# Patient Record
Sex: Male | Born: 1970 | Race: Black or African American | Hispanic: No | Marital: Married | Smoking: Never smoker
Health system: Southern US, Community
[De-identification: ages and names within clinical notes are randomized; demographics above are authoritative.]

## PROBLEM LIST (undated history)

## (undated) HISTORY — PX: NO PAST SURGERIES: SHX2092

---

## 2001-12-08 ENCOUNTER — Emergency Department (HOSPITAL_COMMUNITY): Admission: EM | Admit: 2001-12-08 | Discharge: 2001-12-08 | Payer: Self-pay | Admitting: Emergency Medicine

## 2001-12-08 ENCOUNTER — Encounter: Payer: Self-pay | Admitting: Emergency Medicine

## 2003-12-03 ENCOUNTER — Ambulatory Visit: Payer: Self-pay | Admitting: Family Medicine

## 2004-09-13 ENCOUNTER — Ambulatory Visit: Payer: Self-pay | Admitting: Family Medicine

## 2004-09-15 ENCOUNTER — Ambulatory Visit: Payer: Self-pay | Admitting: Family Medicine

## 2005-02-28 ENCOUNTER — Ambulatory Visit: Payer: Self-pay | Admitting: Family Medicine

## 2006-03-15 ENCOUNTER — Ambulatory Visit: Payer: Self-pay | Admitting: Family Medicine

## 2006-05-03 ENCOUNTER — Ambulatory Visit: Payer: Self-pay | Admitting: Family Medicine

## 2007-05-29 ENCOUNTER — Ambulatory Visit: Payer: Self-pay | Admitting: Family Medicine

## 2007-05-29 DIAGNOSIS — J019 Acute sinusitis, unspecified: Secondary | ICD-10-CM

## 2008-06-02 ENCOUNTER — Encounter: Admission: RE | Admit: 2008-06-02 | Discharge: 2008-06-02 | Payer: Self-pay | Admitting: Occupational Medicine

## 2010-07-06 ENCOUNTER — Encounter: Payer: Self-pay | Admitting: Family Medicine

## 2010-07-10 ENCOUNTER — Encounter: Payer: Self-pay | Admitting: Family Medicine

## 2010-07-10 ENCOUNTER — Ambulatory Visit (INDEPENDENT_AMBULATORY_CARE_PROVIDER_SITE_OTHER): Payer: BC Managed Care – PPO | Admitting: Family Medicine

## 2010-07-10 VITALS — BP 98/60 | HR 49 | Temp 98.3°F | Wt 256.0 lb

## 2010-07-10 DIAGNOSIS — G589 Mononeuropathy, unspecified: Secondary | ICD-10-CM

## 2010-07-10 DIAGNOSIS — G629 Polyneuropathy, unspecified: Secondary | ICD-10-CM

## 2010-07-10 NOTE — Progress Notes (Signed)
  Subjective:    Patient ID: Mathew Atkinson, male    DOB: [birth year], 40 y.o.   MRN: [id number]  HPI Here for 3 weeks of a spot of numbness on the anterior left thigh. No recent trauma. No other neurologic deficits.    Review of Systems  Constitutional: Negative.   Respiratory: Negative.   Cardiovascular: Negative.   Neurological: Positive for numbness. Negative for dizziness, tremors, seizures, syncope, facial asymmetry, speech difficulty, weakness, light-headedness and headaches.       Objective:   Physical Exam  Constitutional: He is oriented to person, place, and time. He appears well-developed and well-nourished.  Neurological: He is alert and oriented to person, place, and time. He has normal reflexes. No cranial nerve deficit. Coordination normal.       Mildly decreased sensation to light touch on the anterior left thigh           Assessment & Plan:  Isolated area of numbness, unclear etiology. Screen with some labs today. He will return prn

## 2010-07-11 LAB — CBC WITH DIFFERENTIAL/PLATELET
Basophils Absolute: 0 10*3/uL (ref 0.0–0.1)
Basophils Relative: 0.8 % (ref 0.0–3.0)
Eosinophils Absolute: 0.1 10*3/uL (ref 0.0–0.7)
Eosinophils Relative: 2.2 % (ref 0.0–5.0)
HCT: 47.2 % (ref 39.0–52.0)
Hemoglobin: 16.2 g/dL (ref 13.0–17.0)
Lymphocytes Relative: 35.6 % (ref 12.0–46.0)
Lymphs Abs: 2.3 10*3/uL (ref 0.7–4.0)
MCHC: 34.3 g/dL (ref 30.0–36.0)
MCV: 87 fl (ref 78.0–100.0)
Monocytes Absolute: 0.6 10*3/uL (ref 0.1–1.0)
Monocytes Relative: 9.6 % (ref 3.0–12.0)
Neutro Abs: 3.4 10*3/uL (ref 1.4–7.7)
Neutrophils Relative %: 51.8 % (ref 43.0–77.0)
Platelets: 262 10*3/uL (ref 150.0–400.0)
RBC: 5.43 Mil/uL (ref 4.22–5.81)
RDW: 13.7 % (ref 11.5–14.6)
WBC: 6.5 10*3/uL (ref 4.5–10.5)

## 2010-07-11 LAB — TSH: TSH: 0.23 u[IU]/mL — ABNORMAL LOW (ref 0.35–5.50)

## 2010-07-11 LAB — BASIC METABOLIC PANEL
BUN: 16 mg/dL (ref 6–23)
CO2: 27 mEq/L (ref 19–32)
Calcium: 9.2 mg/dL (ref 8.4–10.5)
Chloride: 109 mEq/L (ref 96–112)
Creatinine, Ser: 1.2 mg/dL (ref 0.4–1.5)
GFR: 86.75 mL/min (ref >60.00)
Glucose, Bld: 83 mg/dL (ref 70–99)
Potassium: 4.1 mEq/L (ref 3.5–5.1)
Sodium: 141 mEq/L (ref 135–145)

## 2010-07-11 LAB — VITAMIN B12: Vitamin B-12: 621 pg/mL (ref 211–911)

## 2010-07-14 ENCOUNTER — Other Ambulatory Visit (INDEPENDENT_AMBULATORY_CARE_PROVIDER_SITE_OTHER): Payer: BC Managed Care – PPO

## 2010-07-14 DIAGNOSIS — E039 Hypothyroidism, unspecified: Secondary | ICD-10-CM

## 2010-07-14 LAB — T3, FREE: T3, Free: 3.4 pg/mL (ref 2.3–4.2)

## 2010-07-14 LAB — T4, FREE: Free T4: 0.79 ng/dL (ref 0.60–1.60)

## 2010-07-20 ENCOUNTER — Encounter: Payer: Self-pay | Admitting: Family Medicine

## 2010-07-20 ENCOUNTER — Telehealth: Payer: Self-pay | Admitting: Family Medicine

## 2010-07-20 NOTE — Telephone Encounter (Signed)
Pt called and is req to get lab results for tsh.

## 2010-07-21 NOTE — Telephone Encounter (Signed)
These were normal. See the report note

## 2010-07-21 NOTE — Telephone Encounter (Signed)
Left message on machine for patient  To return our call 

## 2011-01-31 ENCOUNTER — Encounter: Payer: Self-pay | Admitting: Family Medicine

## 2011-01-31 ENCOUNTER — Ambulatory Visit (INDEPENDENT_AMBULATORY_CARE_PROVIDER_SITE_OTHER): Payer: BC Managed Care – PPO | Admitting: Family Medicine

## 2011-01-31 VITALS — BP 112/76 | HR 53 | Temp 98.3°F | Wt 255.0 lb

## 2011-01-31 DIAGNOSIS — M76899 Other specified enthesopathies of unspecified lower limb, excluding foot: Secondary | ICD-10-CM

## 2011-01-31 DIAGNOSIS — M7050 Other bursitis of knee, unspecified knee: Secondary | ICD-10-CM

## 2011-01-31 MED ORDER — ETODOLAC 500 MG PO TABS: 500.0000 mg | ORAL_TABLET | Freq: Two times a day (BID) | ORAL | Status: DC

## 2011-01-31 NOTE — Progress Notes (Signed)
  Subjective:    Patient ID: Mathew Atkinson, male    DOB: September 29, 1970, 41 y.o.   MRN: [id number]  HPI Here for 2 weeks of intermittent sharp pains in the right lateral knee. Aleve helps a little, heat does not help. No recent trauma. This started at the same time that he started on a new job. The new job involves a lot more walking on concrete floors and climbing some steps. He is required to wear steel toed boots. He has been wearing gel arch supports inside these boots.    Review of Systems  Constitutional: Negative.   Musculoskeletal: Positive for arthralgias and gait problem.       Objective:   Physical Exam  Constitutional:       He walks with a slight limp  Musculoskeletal:       Tender over the lateral side of the right patella with some edema beneath the patella. Full ROM . No crepitus          Assessment & Plan:  Bursitis, most likely related to his work. Written to be off work [employer], and then he will return to work on 02-08-11. Use ice instead of heat. Try Etodolac bid. Recheck prn

## 2011-04-17 ENCOUNTER — Ambulatory Visit (INDEPENDENT_AMBULATORY_CARE_PROVIDER_SITE_OTHER): Payer: BC Managed Care – PPO | Admitting: Family

## 2011-04-17 ENCOUNTER — Encounter: Payer: Self-pay | Admitting: Family

## 2011-04-17 VITALS — BP 108/90 | Temp 97.8°F | Wt 265.0 lb

## 2011-04-17 DIAGNOSIS — M545 Low back pain, unspecified: Secondary | ICD-10-CM

## 2011-04-17 MED ORDER — CYCLOBENZAPRINE HCL 10 MG PO TABS: 10.0000 mg | ORAL_TABLET | Freq: Three times a day (TID) | ORAL | Status: AC | PRN

## 2011-04-17 NOTE — Patient Instructions (Signed)
Back Pain, Adult Low back pain is very common. About 1 in 5 people have back pain.The cause of low back pain is rarely dangerous. The pain often gets better over time.About half of people with a sudden onset of back pain feel better in just 2 weeks. About 8 in 10 people feel better by 6 weeks.  CAUSES Some common causes of back pain include:  Strain of the muscles or ligaments supporting the spine.   Wear and tear (degeneration) of the spinal discs.   Arthritis.   Direct injury to the back.  DIAGNOSIS Most of the time, the direct cause of low back pain is not known.However, back pain can be treated effectively even when the exact cause of the pain is unknown.Answering your caregiver's questions about your overall health and symptoms is one of the most accurate ways to make sure the cause of your pain is not dangerous. If your caregiver needs more information, he or she may order lab work or imaging tests (X-rays or MRIs).However, even if imaging tests show changes in your back, this usually does not require surgery. HOME CARE INSTRUCTIONS For many people, back pain returns.Since low back pain is rarely dangerous, it is often a condition that people can learn to manageon their own.   Remain active. It is stressful on the back to sit or stand in one place. Do not sit, drive, or stand in one place for more than 30 minutes at a time. Take short walks on level surfaces as soon as pain allows.Try to increase the length of time you walk each day.   Do not stay in bed.Resting more than 1 or 2 days can delay your recovery.   Do not avoid exercise or work.Your body is made to move.It is not dangerous to be active, even though your back may hurt.Your back will likely heal faster if you return to being active before your pain is gone.   Pay attention to your body when you bend and lift. Many people have less discomfortwhen lifting if they bend their knees, keep the load close to their  bodies,and avoid twisting. Often, the most comfortable positions are those that put less stress on your recovering back.   Find a comfortable position to sleep. Use a firm mattress and lie on your side with your knees slightly bent. If you lie on your back, put a pillow under your knees.   Only take over-the-counter or prescription medicines as directed by your caregiver. Over-the-counter medicines to reduce pain and inflammation are often the most helpful.Your caregiver may prescribe muscle relaxant drugs.These medicines help dull your pain so you can more quickly return to your normal activities and healthy exercise.   Put ice on the injured area.   Put ice in a plastic bag.   Place a towel between your skin and the bag.   Leave the ice on for 15 to 20 minutes, 3 to 4 times a day for the first 2 to 3 days. After that, ice and heat may be alternated to reduce pain and spasms.   Ask your caregiver about trying back exercises and gentle massage. This may be of some benefit.   Avoid feeling anxious or stressed.Stress increases muscle tension and can worsen back pain.It is important to recognize when you are anxious or stressed and learn ways to manage it.Exercise is a great option.  SEEK MEDICAL CARE IF:  You have pain that is not relieved with rest or medicine.   You have   pain that does not improve in 1 week.   You have new symptoms.   You are generally not feeling well.  SEEK IMMEDIATE MEDICAL CARE IF:   You have pain that radiates from your back into your legs.   You develop new bowel or bladder control problems.   You have unusual weakness or numbness in your arms or legs.   You develop nausea or vomiting.   You develop abdominal pain.   You feel faint.  Document Released: 01/01/2005 Document Revised: 12/21/2010 Document Reviewed: 05/22/2010 [employer] Patient Information 2012 [employer]. 

## 2011-04-17 NOTE — Progress Notes (Signed)
  Subjective:    Patient ID: Mathew Atkinson, male    DOB: 12-10-1970, 41 y.o.   MRN: [id number]  HPI 41 year old African American male, nonsmoker, patient of Dr. Clent Ridges and low back pain. His back pain is improved since yesterday with a reasonably clear. He reached his pain today about a 1/10 currently takes Lodine twice a day. Describes the pain as aching and constant. Worse with movement. He also took Aleve. Patient denies any lightheadedness, dizziness, loss of bowel or bladder control, no frequency urgency to urinate.   Review of Systems  Constitutional: Negative.   Respiratory: Negative.   Cardiovascular: Negative.   Musculoskeletal: Positive for back pain. Negative for joint swelling.  Hematological: Negative.   Psychiatric/Behavioral: Negative.    No past medical history on file.  History   Social History  . Marital Status: Married    Spouse Name: N/A    Number of Children: N/A  . Years of Education: N/A   Occupational History  . Not on file.   Social History Main Topics  . Smoking status: Never Smoker   . Smokeless tobacco: Never Used  . Alcohol Use: Yes     one time a week or less  . Drug Use: No  . Sexually Active: Not on file   Other Topics Concern  . Not on file   Social History Narrative   MarriedRegular exercise- no    Past Surgical History  Procedure Date  . No past surgeries     Family History  Problem Relation Age of Onset  . Diabetes    . Thyroid disease      No Known Allergies  Current Outpatient Prescriptions on File Prior to Visit  Medication Sig Dispense Refill  . etodolac (LODINE) 500 MG tablet Take 1 tablet (500 mg total) by mouth 2 (two) times daily.  60 tablet  5    BP 108/90  Temp(Src) 97.8 F (36.6 C) (Oral)  Wt 265 lb (120.203 kg)chart    Objective:   Physical Exam  Constitutional: He is oriented to person, place, and time. He appears well-developed and well-nourished.  Neck: Normal range of motion. Neck supple.    Cardiovascular: Normal rate, regular rhythm and normal heart sounds.   Pulmonary/Chest: Effort normal and breath sounds normal.  Abdominal: Soft.  Musculoskeletal: Normal range of motion. He exhibits no edema and no tenderness.       Negative SLR. Normal ROM. No pain. No pain to palpation.  Neurological: He is alert and oriented to person, place, and time.  Skin: Skin is warm.  Psychiatric: He has a normal mood and affect.          Assessment & Plan:  Assessment: Low back pain-improving  Plan: Continue Lodine. Advised not to take an additional anti-inflammatory. Flexeril 10 mg 3 times a day when necessary as needed for low back pain. Warned of drowsiness. Ice and heat to the affected area. All the office if symptoms worsen or persist, recheck as scheduled and when necessary.

## 2011-12-03 ENCOUNTER — Ambulatory Visit (INDEPENDENT_AMBULATORY_CARE_PROVIDER_SITE_OTHER): Payer: BC Managed Care – PPO | Admitting: Family Medicine

## 2011-12-03 ENCOUNTER — Encounter: Payer: Self-pay | Admitting: Family Medicine

## 2011-12-03 VITALS — BP 118/80 | HR 63 | Temp 98.6°F | Wt 258.0 lb

## 2011-12-03 DIAGNOSIS — J40 Bronchitis, not specified as acute or chronic: Secondary | ICD-10-CM

## 2011-12-03 MED ORDER — AZITHROMYCIN 250 MG PO TABS: ORAL_TABLET | ORAL | Status: DC

## 2011-12-03 MED ORDER — HYDROCODONE-HOMATROPINE 5-1.5 MG/5ML PO SYRP: 5.0000 mL | ORAL_SOLUTION | ORAL | Status: AC | PRN

## 2011-12-03 NOTE — Progress Notes (Signed)
  Subjective:    Patient ID: Mathew Atkinson, male    DOB: January 11, 1971, 41 y.o.   MRN: [id number]  HPI Here for 4 days of chest congestion and coughing up brown sputum. No fever.    Review of Systems  Constitutional: Negative.   HENT: Negative.   Eyes: Negative.   Respiratory: Positive for cough and chest tightness. Negative for shortness of breath and wheezing.   Cardiovascular: Negative.        Objective:   Physical Exam  Constitutional: He appears well-developed and well-nourished.  HENT:  Right Ear: External ear normal.  Left Ear: External ear normal.  Mouth/Throat: Oropharynx is clear and moist.  Eyes: Conjunctivae normal are normal.  Pulmonary/Chest: Effort normal. No respiratory distress. He has no wheezes. He has no rales.       Scattered rhonchi  Lymphadenopathy:    He has no cervical adenopathy.          Assessment & Plan:  Out of work today.

## 2013-02-06 ENCOUNTER — Encounter: Payer: Self-pay | Admitting: Family Medicine

## 2013-02-06 ENCOUNTER — Ambulatory Visit (INDEPENDENT_AMBULATORY_CARE_PROVIDER_SITE_OTHER): Payer: 59 | Admitting: Family Medicine

## 2013-02-06 VITALS — BP 124/70 | HR 63 | Temp 99.9°F | Ht 70.5 in | Wt 264.0 lb

## 2013-02-06 DIAGNOSIS — J209 Acute bronchitis, unspecified: Secondary | ICD-10-CM

## 2013-02-06 MED ORDER — AZITHROMYCIN 250 MG PO TABS: ORAL_TABLET | ORAL | Status: DC

## 2013-02-06 MED ORDER — HYDROCODONE-HOMATROPINE 5-1.5 MG/5ML PO SYRP: 5.0000 mL | ORAL_SOLUTION | ORAL | Status: DC | PRN

## 2013-02-06 NOTE — Progress Notes (Signed)
Pre visit review using our clinic review tool, if applicable. No additional management support is needed unless otherwise documented below in the visit note. 

## 2013-02-06 NOTE — Progress Notes (Signed)
   Subjective:    Patient ID: Mathew Atkinson, male    DOB: [birth year], 43 y.o.   MRN: [id number]  HPI Here for 2 days of fever to 102 degrees, coughing up green sputum, aches, and vomiting. Using Theraflu and Advil.    Review of Systems  Constitutional: Positive for fever.  HENT: Positive for congestion. Negative for sinus pressure.   Eyes: Negative.   Respiratory: Positive for cough.        Objective:   Physical Exam  Constitutional:  Ill appearing   HENT:  Right Ear: External ear normal.  Left Ear: External ear normal.  Nose: Nose normal.  Mouth/Throat: Oropharynx is clear and moist.  Eyes: Conjunctivae are normal.  Pulmonary/Chest: Effort normal. No respiratory distress. He has no wheezes. He has no rales.  Scattered rhonchi  Lymphadenopathy:    He has no cervical adenopathy.          Assessment & Plan:  Bronchitis secondary to influenza. Written out of work 02-05-13 until 02-11-13.

## 2013-05-27 ENCOUNTER — Encounter: Payer: Self-pay | Admitting: Family Medicine

## 2013-05-27 ENCOUNTER — Ambulatory Visit (INDEPENDENT_AMBULATORY_CARE_PROVIDER_SITE_OTHER): Payer: BC Managed Care – PPO | Admitting: Family Medicine

## 2013-05-27 VITALS — BP 132/82 | HR 66 | Temp 99.1°F | Ht 70.5 in | Wt 270.0 lb

## 2013-05-27 DIAGNOSIS — L0203 Carbuncle of face: Secondary | ICD-10-CM

## 2013-05-27 DIAGNOSIS — L0202 Furuncle of face: Secondary | ICD-10-CM

## 2013-05-27 MED ORDER — DOXYCYCLINE HYCLATE 100 MG PO CAPS: 100.0000 mg | ORAL_CAPSULE | Freq: Two times a day (BID) | ORAL | Status: AC

## 2013-05-27 NOTE — Progress Notes (Signed)
   Subjective:    Patient ID: Mathew Atkinson, male    DOB: 05-21-1970, 43 y.o.   MRN: [id number]  HPI Here for 5 days of a swollen painful lump on the left cheek. No fever.    Review of Systems  Constitutional: Negative.   Skin: Positive for wound.       Objective:   Physical Exam  Constitutional: He appears well-developed and well-nourished.  Lymphadenopathy:    He has no cervical adenopathy.  Skin:  Tender boil on the left cheek           Assessment & Plan:  The boil was cleansed with alcohol and lanced with a scalpel. We expressed some purulence and sent a sample for culture. Start on Doxycycline for 14 days, add warm compresses

## 2013-05-27 NOTE — Progress Notes (Signed)
Pre visit review using our clinic review tool, if applicable. No additional management support is needed unless otherwise documented below in the visit note. 

## 2013-05-30 LAB — WOUND CULTURE
GRAM STAIN: NONE SEEN
GRAM STAIN: NONE SEEN
GRAM STAIN: NONE SEEN

## 2013-09-12 ENCOUNTER — Encounter (HOSPITAL_COMMUNITY): Payer: Self-pay | Admitting: Emergency Medicine

## 2013-09-12 ENCOUNTER — Emergency Department (HOSPITAL_COMMUNITY)
Admission: EM | Admit: 2013-09-12 | Discharge: 2013-09-12 | Disposition: A | Discharge status: Home / Self Care | Payer: BC Managed Care – PPO | Attending: Emergency Medicine | Admitting: Emergency Medicine

## 2013-09-12 DIAGNOSIS — L02818 Cutaneous abscess of other sites: Secondary | ICD-10-CM | POA: Insufficient documentation

## 2013-09-12 DIAGNOSIS — E669 Obesity, unspecified: Secondary | ICD-10-CM | POA: Diagnosis not present

## 2013-09-12 DIAGNOSIS — L03818 Cellulitis of other sites: Principal | ICD-10-CM

## 2013-09-12 DIAGNOSIS — L0291 Cutaneous abscess, unspecified: Secondary | ICD-10-CM

## 2013-09-12 DIAGNOSIS — L0390 Cellulitis, unspecified: Secondary | ICD-10-CM

## 2013-09-12 MED ORDER — CEPHALEXIN 250 MG PO CAPS
500.0000 mg | ORAL_CAPSULE | Freq: Once | ORAL | Status: AC
  Administered 2013-09-12: 500 mg via ORAL
  Filled 2013-09-12: qty 2

## 2013-09-12 MED ORDER — CEPHALEXIN 500 MG PO CAPS: 500.0000 mg | ORAL_CAPSULE | Freq: Four times a day (QID) | ORAL | Status: DC

## 2013-09-12 MED ORDER — TRAMADOL HCL 50 MG PO TABS: 50.0000 mg | ORAL_TABLET | Freq: Four times a day (QID) | ORAL | Status: DC | PRN

## 2013-09-12 NOTE — ED Notes (Signed)
Pt states that he thinks he was bitten by something. Pt has a large abscess on the back of his head.

## 2013-09-12 NOTE — Discharge Instructions (Signed)

## 2013-09-12 NOTE — ED Provider Notes (Signed)
CSN: [id number]     Arrival date & time 09/12/13  2139 History  This chart was scribed for non-physician practitioner, Delos Haring, PA-C,working with Evelina Bucy, MD, by Marlowe Kays, ED Scribe. This patient was seen in room TR07C/TR07C and the patient's care was started at 10:00 PM.  Chief Complaint  Patient presents with  . Abscess   The history is provided by the patient. No language interpreter was used.   HPI Comments:  Mathew Atkinson is a 43 y.o. obese male who presents to the Emergency Department complaining of severe pain secondary to an abscess located on the occipital notch of his head onset. He noticed it recently. Wife reports that she got a lot of puss out of it yesterday but it still hurts. He states he thinks he may have been bitten by something. He denies fever or chills.   History reviewed. No pertinent past medical history. Past Surgical History  Procedure Laterality Date  . No past surgeries     Family History  Problem Relation Age of Onset  . Diabetes    . Thyroid disease     History  Substance Use Topics  . Smoking status: Never Smoker   . Smokeless tobacco: Never Used  . Alcohol Use: Yes     Comment: once or twice a month    Review of Systems  Constitutional: Negative for fever and chills.  Skin:       Abscess to the back of head  All other systems reviewed and are negative.   Allergies  Review of patient's allergies indicates no known allergies.  Home Medications   Prior to Admission medications   Medication Sig Start Date End Date Taking? Authorizing Provider  cephALEXin (KEFLEX) 500 MG capsule Take 1 capsule (500 mg total) by mouth 4 (four) times daily. 09/12/13   Avant Printy Marilu Favre, PA-C  traMADol (ULTRAM) 50 MG tablet Take 1 tablet (50 mg total) by mouth every 6 (six) hours as needed. 09/12/13   Linus Mako, PA-C   Triage Vitals: BP 139/83  Pulse 54  Temp(Src) 98.1 F (36.7 C) (Oral)  Ht 5\' 10"  (1.778 m)  Wt 260 lb (117.935 kg)  BMI  37.31 kg/m2  SpO2 98% Physical Exam  Nursing note and vitals reviewed. Constitutional: He is oriented to person, place, and time. He appears well-developed and well-nourished.  HENT:  Head: Normocephalic and atraumatic.    Eyes: EOM are normal.  Neck: Normal range of motion. No spinous process tenderness and no muscular tenderness present. Normal range of motion present.  Cardiovascular: Normal rate.   Pulmonary/Chest: Effort normal.  Musculoskeletal: Normal range of motion.  Neurological: He is alert and oriented to person, place, and time.  Skin: Skin is warm and dry.  Psychiatric: He has a normal mood and affect. His behavior is normal.    ED Course  Procedures (including critical care time) DIAGNOSTIC STUDIES: Oxygen Saturation is 98% on RA, normal by my interpretation.   COORDINATION OF CARE: 10:03 PM- Will drain abscess. Pt verbalizes understanding and agrees to plan.  INCISION AND DRAINAGE PROCEDURE NOTE: Patient identification was confirmed and verbal consent was obtained. This procedure was performed by Delos Haring, PA-C at 10:03 PM. Site: occipital notch Sterile procedures observed Needle size: 18 G Anesthetic used (type and amt): Lidocaine 2% without Epinephrine (1 mL) Blade size: 11 Drainage: small Complexity: Complex Packing used: none Site anesthetized, incision made over site, wound drained and explored loculations, rinsed with copious amounts of normal saline,  wound packed with sterile gauze, covered with dry, sterile dressing.  Pt tolerated procedure well without complications.  Instructions for care discussed verbally and pt provided with additional written instructions for homecare and f/u.   Medications  cephALEXin (KEFLEX) capsule 500 mg (not administered)    Labs Review Labs Reviewed - No data to display  Imaging Review No results found.   EKG Interpretation None      MDM   Final diagnoses:  Cellulitis and abscess     Medications  cephALEXin (KEFLEX) capsule 500 mg (not administered)    Started on keflex and rx for pain medications  43 y.o.Jones Broom Ecklund's evaluation in the Emergency Department is complete. It has been determined that no acute conditions requiring further emergency intervention are present at this time. The patient/guardian have been advised of the diagnosis and plan. We have discussed signs and symptoms that warrant return to the ED, such as changes or worsening in symptoms.  Vital signs are stable at discharge. Filed Vitals:   09/12/13 2145  BP: 139/83  Pulse: 54  Temp: 98.1 F (36.7 C)    Patient/guardian has voiced understanding and agreed to follow-up with the PCP or specialist.   I personally performed the services described in this documentation, which was scribed in my presence. The recorded information has been reviewed and is accurate.    Linus Mako, PA-C 09/12/13 2221

## 2013-09-13 NOTE — ED Provider Notes (Signed)
Medical screening examination/treatment/procedure(s) were performed by non-physician practitioner and as supervising physician I was immediately available for consultation/collaboration.   EKG Interpretation None        Evelina Bucy, MD 09/13/13 0001

## 2014-06-28 ENCOUNTER — Encounter: Payer: Self-pay | Admitting: Family Medicine

## 2014-06-28 ENCOUNTER — Ambulatory Visit (INDEPENDENT_AMBULATORY_CARE_PROVIDER_SITE_OTHER): Payer: BLUE CROSS/BLUE SHIELD | Admitting: Family Medicine

## 2014-06-28 VITALS — BP 112/80 | HR 64 | Temp 98.6°F | Wt 257.0 lb

## 2014-06-28 DIAGNOSIS — H00016 Hordeolum externum left eye, unspecified eyelid: Secondary | ICD-10-CM | POA: Diagnosis not present

## 2014-06-28 DIAGNOSIS — H1089 Other conjunctivitis: Secondary | ICD-10-CM

## 2014-06-28 DIAGNOSIS — H109 Unspecified conjunctivitis: Secondary | ICD-10-CM

## 2014-06-28 DIAGNOSIS — A499 Bacterial infection, unspecified: Secondary | ICD-10-CM | POA: Diagnosis not present

## 2014-06-28 MED ORDER — POLYMYXIN B-TRIMETHOPRIM 10000-0.1 UNIT/ML-% OP SOLN: 2.0000 [drp] | OPHTHALMIC | Status: DC

## 2014-06-28 MED ORDER — DOXYCYCLINE HYCLATE 100 MG PO CAPS: 100.0000 mg | ORAL_CAPSULE | Freq: Two times a day (BID) | ORAL | Status: DC

## 2014-06-28 NOTE — Patient Instructions (Signed)
Sty A sty (hordeolum) is an infection of a gland in the eyelid located at the base of the eyelash. A sty may develop a white or yellow head of pus. It can be puffy (swollen). Usually, the sty will burst and pus will come out on its own. They do not leave lumps in the eyelid once they drain. A sty is often confused with another form of cyst of the eyelid called a chalazion. Chalazions occur within the eyelid and not on the edge where the bases of the eyelashes are. They often are red, sore and then form firm lumps in the eyelid. CAUSES   Germs (bacteria).  Lasting (chronic) eyelid inflammation. SYMPTOMS   Tenderness, redness and swelling along the edge of the eyelid at the base of the eyelashes.  Sometimes, there is a white or yellow head of pus. It may or may not drain. DIAGNOSIS  An ophthalmologist will be able to distinguish between a sty and a chalazion and treat the condition appropriately.  TREATMENT   Styes are typically treated with warm packs (compresses) until drainage occurs.  In rare cases, medicines that kill germs (antibiotics) may be prescribed. These antibiotics may be in the form of drops, cream or pills.  If a hard lump has formed, it is generally necessary to do a small incision and remove the hardened contents of the cyst in a minor surgical procedure done in the office.  In suspicious cases, your caregiver may send the contents of the cyst to the lab to be certain that it is not a rare, but dangerous form of cancer of the glands of the eyelid. HOME CARE INSTRUCTIONS   Wash your hands often and dry them with a clean towel. Avoid touching your eyelid. This may spread the infection to other parts of the eye.  Apply heat to your eyelid for 10 to 20 minutes, several times a day, to ease pain and help to heal it faster.  Do not squeeze the sty. Allow it to drain on its own. Wash your eyelid carefully 3 to 4 times per day to remove any pus. SEEK IMMEDIATE MEDICAL CARE IF:    Your eye becomes painful or puffy (swollen).  Your vision changes.  Your sty does not drain by itself within 3 days.  Your sty comes back within a short period of time, even with treatment.  You have redness (inflammation) around the eye.  You have a fever. Document Released: 10/11/2004 Document Revised: 03/26/2011 Document Reviewed: 04/17/2013 [facility] Patient Information 2015 [place], [place]. This information is not intended to replace advice given to you by your health care provider. Make sure you discuss any questions you have with your health care provider.  Warm compresses to eye several times daily.

## 2014-06-28 NOTE — Progress Notes (Signed)
   Subjective:    Patient ID: Mathew Atkinson, male    DOB: May 14, 1970, 44 y.o.   MRN: [id number]  HPI Acute visit for left eye irritation. Onset last night. This morning he had crusted drainage. He has what he attributed to a stye lower lid. No visual problems. No recent fevers or chills. No eye injury. No known drug allergies  No past medical history on file. Past Surgical History  Procedure Laterality Date  . No past surgeries      reports that he has never smoked. He has never used smokeless tobacco. He reports that he drinks alcohol. He reports that he does not use illicit drugs. family history includes Diabetes in an other family member; Thyroid disease in an other family member. No Known Allergies    Review of Systems  Constitutional: Negative for fever and chills.  HENT: Positive for congestion.   Eyes: Positive for discharge, redness and itching. Negative for pain and visual disturbance.       Objective:   Physical Exam  Constitutional: He appears well-developed and well-nourished.  HENT:  Head: Normocephalic and atraumatic.  Mouth/Throat: Oropharynx is clear and moist.  Eyes: EOM are normal. Right eye exhibits no discharge. Left eye exhibits discharge.  Left eye reveals erythematous conjunctiva. He has probable small hordeolum along the mid aspect left lower lid. Slightly tender to palpation. He has some visible. Secretions conjunctiva region. Cornea appears normal          Assessment & Plan:  Probable acute bacterial conjunctivitis left eye as well as hordeolum/stye.  Warm compresses several times daily. Polytrim ophthalmic drops every 2-4 hours while awake. Doxycycline 100 mg twice daily for 7 days. Follow-up with primary in 3-4 days if not improving

## 2014-06-28 NOTE — Progress Notes (Signed)
Pre visit review using our clinic review tool, if applicable. No additional management support is needed unless otherwise documented below in the visit note. 

## 2015-12-27 ENCOUNTER — Ambulatory Visit (INDEPENDENT_AMBULATORY_CARE_PROVIDER_SITE_OTHER): Payer: BLUE CROSS/BLUE SHIELD | Admitting: Family Medicine

## 2015-12-27 ENCOUNTER — Encounter: Payer: Self-pay | Admitting: Family Medicine

## 2015-12-27 VITALS — BP 115/68 | HR 56 | Temp 98.2°F | Ht 70.0 in | Wt 271.0 lb

## 2015-12-27 DIAGNOSIS — M722 Plantar fascial fibromatosis: Secondary | ICD-10-CM | POA: Insufficient documentation

## 2015-12-27 MED ORDER — DICLOFENAC SODIUM 75 MG PO TBEC: 75.0000 mg | DELAYED_RELEASE_TABLET | Freq: Two times a day (BID) | ORAL | 2 refills | Status: DC

## 2015-12-27 NOTE — Progress Notes (Signed)
   Subjective:    Patient ID: Mathew Atkinson, male    DOB: Apr 12, 1970, 45 y.o.   MRN: [id number]  HPI Here for 2 weeks of intermittent pain in the bottom of the left foot. No swelling. No recent trauma. He spends most of is time on his feet on his job and he wears steel toed boots. He has taken Advil with partial relief.    Review of Systems  Constitutional: Negative.   Musculoskeletal: Positive for arthralgias.       Objective:   Physical Exam  Constitutional: He appears well-developed and well-nourished.  Musculoskeletal: He exhibits no edema.  He is tender on the bottom of the left heel           Assessment & Plan:  Plantar fasciitis. Given Diclofenac bid and he will wear arch supports inside his shoes and work boots. Apply ice when he gets home.  Laurey Morale, MD

## 2015-12-27 NOTE — Progress Notes (Signed)
Pre visit review using our clinic review tool, if applicable. No additional management support is needed unless otherwise documented below in the visit note. 

## 2016-07-06 ENCOUNTER — Encounter: Payer: Self-pay | Admitting: Family Medicine

## 2016-07-06 ENCOUNTER — Ambulatory Visit (INDEPENDENT_AMBULATORY_CARE_PROVIDER_SITE_OTHER): Payer: BLUE CROSS/BLUE SHIELD | Admitting: Family Medicine

## 2016-07-06 VITALS — BP 110/62 | Temp 98.2°F | Ht 70.0 in | Wt 280.0 lb

## 2016-07-06 DIAGNOSIS — Z0000 Encounter for general adult medical examination without abnormal findings: Secondary | ICD-10-CM | POA: Diagnosis not present

## 2016-07-06 NOTE — Progress Notes (Signed)
   Subjective:    Patient ID: Mathew Atkinson, male    DOB: [birth year], 46 y.o.   MRN: [id number]  HPI 46 yr old male for a well exam. He feels well but knows he is overweight.    Review of Systems  Constitutional: Negative.   HENT: Negative.   Eyes: Negative.   Respiratory: Negative.   Cardiovascular: Negative.   Gastrointestinal: Negative.   Genitourinary: Negative.   Musculoskeletal: Negative.   Skin: Negative.   Neurological: Negative.   Psychiatric/Behavioral: Negative.        Objective:   Physical Exam  Constitutional: He is oriented to person, place, and time. He appears well-developed and well-nourished. No distress.  HENT:  Head: Normocephalic and atraumatic.  Right Ear: External ear normal.  Left Ear: External ear normal.  Nose: Nose normal.  Mouth/Throat: Oropharynx is clear and moist. No oropharyngeal exudate.  Eyes: Conjunctivae and EOM are normal. Pupils are equal, round, and reactive to light. Right eye exhibits no discharge. Left eye exhibits no discharge. No scleral icterus.  Neck: Neck supple. No JVD present. No tracheal deviation present. No thyromegaly present.  Cardiovascular: Normal rate, regular rhythm, normal heart sounds and intact distal pulses.  Exam reveals no gallop and no friction rub.   No murmur heard. Pulmonary/Chest: Effort normal and breath sounds normal. No respiratory distress. He has no wheezes. He has no rales. He exhibits no tenderness.  Abdominal: Soft. Bowel sounds are normal. He exhibits no distension and no mass. There is no tenderness. There is no rebound and no guarding.  Genitourinary: Rectum normal, prostate normal and penis normal. Rectal exam shows guaiac negative stool. No penile tenderness.  Musculoskeletal: Normal range of motion. He exhibits no edema or tenderness.  Lymphadenopathy:    He has no cervical adenopathy.  Neurological: He is alert and oriented to person, place, and time. He has normal reflexes. No cranial nerve  deficit. He exhibits normal muscle tone. Coordination normal.  Skin: Skin is warm and dry. No rash noted. He is not diaphoretic. No erythema. No pallor.  Psychiatric: He has a normal mood and affect. His behavior is normal. Judgment and thought content normal.          Assessment & Plan:  Well exam. We discussed diet and exercise. He will return soon for fasting labs.  Alysia Penna, MD

## 2016-07-06 NOTE — Patient Instructions (Signed)
WE NOW OFFER   [facility] FAST TRACK!!!  SAME DAY Appointments for ACUTE CARE  Such as: Sprains, Injuries, cuts, abrasions, rashes, muscle pain, joint pain, back pain Colds, flu, sore throats, headache, allergies, cough, fever  Ear pain, sinus and eye infections Abdominal pain, nausea, vomiting, diarrhea, upset stomach Animal/insect bites  3 Easy Ways to Schedule: Walk-In Scheduling Call in scheduling Mychart Sign-up: [email or web address]         

## 2016-07-09 ENCOUNTER — Other Ambulatory Visit (INDEPENDENT_AMBULATORY_CARE_PROVIDER_SITE_OTHER): Payer: BLUE CROSS/BLUE SHIELD

## 2016-07-09 DIAGNOSIS — Z0000 Encounter for general adult medical examination without abnormal findings: Secondary | ICD-10-CM

## 2016-07-09 LAB — CBC WITH DIFFERENTIAL/PLATELET
Basophils Absolute: 0.1 10*3/uL (ref 0.0–0.1)
Basophils Relative: 1.1 % (ref 0.0–3.0)
EOS PCT: 2.7 % (ref 0.0–5.0)
Eosinophils Absolute: 0.1 10*3/uL (ref 0.0–0.7)
HCT: 43.9 % (ref 39.0–52.0)
Hemoglobin: 15 g/dL (ref 13.0–17.0)
LYMPHS ABS: 1.8 10*3/uL (ref 0.7–4.0)
Lymphocytes Relative: 35.8 % (ref 12.0–46.0)
MCHC: 34.2 g/dL (ref 30.0–36.0)
MCV: 85.8 fl (ref 78.0–100.0)
MONOS PCT: 10.9 % (ref 3.0–12.0)
Monocytes Absolute: 0.5 10*3/uL (ref 0.1–1.0)
NEUTROS PCT: 49.5 % (ref 43.0–77.0)
Neutro Abs: 2.5 10*3/uL (ref 1.4–7.7)
Platelets: 275 10*3/uL (ref 150.0–400.0)
RBC: 5.12 Mil/uL (ref 4.22–5.81)
RDW: 14 % (ref 11.5–15.5)
WBC: 5 10*3/uL (ref 4.0–10.5)

## 2016-07-09 LAB — BASIC METABOLIC PANEL
BUN: 17 mg/dL (ref 6–23)
CHLORIDE: 105 meq/L (ref 96–112)
CO2: 29 meq/L (ref 19–32)
CREATININE: 1.4 mg/dL (ref 0.40–1.50)
Calcium: 9.8 mg/dL (ref 8.4–10.5)
GFR: 69.94 mL/min (ref >60.00)
GLUCOSE: 91 mg/dL (ref 70–99)
POTASSIUM: 4.3 meq/L (ref 3.5–5.1)
Sodium: 141 mEq/L (ref 135–145)

## 2016-07-09 LAB — POC URINALSYSI DIPSTICK (AUTOMATED)
BILIRUBIN UA: NEGATIVE
Blood, UA: NEGATIVE
Glucose, UA: NEGATIVE
KETONES UA: NEGATIVE
LEUKOCYTES UA: NEGATIVE
Nitrite, UA: NEGATIVE
PROTEIN UA: NEGATIVE
Spec Grav, UA: 1.025 (ref 1.010–1.025)
Urobilinogen, UA: 0.2 E.U./dL
pH, UA: 6 (ref 5.0–8.0)

## 2016-07-09 LAB — HEPATIC FUNCTION PANEL
ALBUMIN: 4.1 g/dL (ref 3.5–5.2)
ALK PHOS: 60 U/L (ref 39–117)
ALT: 23 U/L (ref 0–53)
AST: 19 U/L (ref 0–37)
Bilirubin, Direct: 0.1 mg/dL (ref 0.0–0.3)
Total Bilirubin: 0.5 mg/dL (ref 0.2–1.2)
Total Protein: 6.9 g/dL (ref 6.0–8.3)

## 2016-07-09 LAB — LIPID PANEL
CHOLESTEROL: 178 mg/dL (ref 0–200)
HDL: 49.5 mg/dL (ref >39.00)
LDL Cholesterol: 109 mg/dL — ABNORMAL HIGH (ref 0–99)
NonHDL: 128.31
Total CHOL/HDL Ratio: 4
Triglycerides: 96 mg/dL (ref 0.0–149.0)
VLDL: 19.2 mg/dL (ref 0.0–40.0)

## 2016-07-09 LAB — PSA: PSA: 0.75 ng/mL (ref 0.10–4.00)

## 2016-07-09 LAB — TSH: TSH: 1.62 u[IU]/mL (ref 0.35–4.50)

## 2016-07-13 ENCOUNTER — Telehealth: Payer: Self-pay | Admitting: Family Medicine

## 2016-07-13 NOTE — Telephone Encounter (Signed)
Pt would like blood work result. Pt is aware md out of office until monday

## 2016-07-16 NOTE — Telephone Encounter (Signed)
See my Result Note  

## 2016-07-17 NOTE — Telephone Encounter (Signed)
I spoke with pt and gave results.  

## 2017-07-29 ENCOUNTER — Ambulatory Visit (INDEPENDENT_AMBULATORY_CARE_PROVIDER_SITE_OTHER): Payer: BLUE CROSS/BLUE SHIELD | Admitting: Family Medicine

## 2017-07-29 ENCOUNTER — Encounter: Payer: Self-pay | Admitting: Family Medicine

## 2017-07-29 VITALS — BP 122/70 | HR 60 | Temp 98.1°F | Ht 70.0 in | Wt 288.0 lb

## 2017-07-29 DIAGNOSIS — M25511 Pain in right shoulder: Secondary | ICD-10-CM | POA: Diagnosis not present

## 2017-07-29 MED ORDER — METHYLPREDNISOLONE 4 MG PO TBPK: ORAL_TABLET | ORAL | 0 refills | Status: DC

## 2017-07-29 NOTE — Progress Notes (Signed)
   Subjective:    Patient ID: Mathew Atkinson, male    DOB: 1970/09/25, 47 y.o.   MRN: [id number]  HPI Here for one week of sharp pains in the right shoulder which are the worst when he raises his arm abiove his head. Ibuprofen helps a little, Aleve does not. 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    Review of Systems  Constitutional: Negative.   Respiratory: Negative.   Cardiovascular: Negative.   Musculoskeletal: Positive for arthralgias.       Objective:   Physical Exam  Constitutional: He appears well-developed and well-nourished.  Cardiovascular: Normal rate, regular rhythm, normal heart sounds and intact distal pulses.  Pulmonary/Chest: Effort normal and breath sounds normal.  Musculoskeletal:  The right shoulder is not tender and there is no crepitus. He can lift his right arm above his head but he feels a lot of pain when he gets up to horizontal           Assessment & Plan:  Shoulder pain, consistent with tendonitis of the rotator cuff. He will try a Medrol dose pack. He also needs to rest the shoulder so he will speak to his manager to see if he can do something other than lifting the rolls of foil for a few weeks. Recheck prn. Alysia Penna, MD

## 2018-03-11 ENCOUNTER — Encounter: Payer: Self-pay | Admitting: Family Medicine

## 2018-03-11 ENCOUNTER — Ambulatory Visit (INDEPENDENT_AMBULATORY_CARE_PROVIDER_SITE_OTHER): Payer: BLUE CROSS/BLUE SHIELD | Admitting: Family Medicine

## 2018-03-11 VITALS — BP 116/80 | HR 57 | Temp 97.9°F | Ht 70.5 in | Wt 291.2 lb

## 2018-03-11 DIAGNOSIS — D1724 Benign lipomatous neoplasm of skin and subcutaneous tissue of left leg: Secondary | ICD-10-CM | POA: Diagnosis not present

## 2018-03-11 NOTE — Progress Notes (Signed)
   Subjective:    Patient ID: EWIN REHBERG, male    DOB: October 26, 1970, 48 y.o.   MRN: [id number]  HPI Here for one month of numbness and sometimes pain in the anterior left thigh. No recent trauma. No back problems. He is on his feet constantly at work and the thigh becomes painful by the end of his shift.    Review of Systems  Constitutional: Negative.   Respiratory: Negative.   Cardiovascular: Negative.   Musculoskeletal: Positive for myalgias.  Neurological: Positive for numbness. Negative for weakness.       Objective:   Physical Exam Constitutional:      Appearance: Normal appearance.  Cardiovascular:     Rate and Rhythm: Normal rate and regular rhythm.     Pulses: Normal pulses.     Heart sounds: Normal heart sounds.  Pulmonary:     Effort: Pulmonary effort is normal.     Breath sounds: Normal breath sounds.  Musculoskeletal:     Comments: The anterior left thigh has a firm mobile well defined mass beneath the skin which is tender. He has an area of decreased touch sensitivity exactly over this mass   Neurological:     Mental Status: He is alert.           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 We will refer to Surgery to consider an excision.  Alysia Penna, MD

## 2018-03-20 DIAGNOSIS — M792 Neuralgia and neuritis, unspecified: Secondary | ICD-10-CM | POA: Diagnosis not present

## 2018-03-25 ENCOUNTER — Other Ambulatory Visit: Payer: Self-pay | Admitting: General Surgery

## 2018-03-25 DIAGNOSIS — M792 Neuralgia and neuritis, unspecified: Secondary | ICD-10-CM

## 2018-03-25 DIAGNOSIS — S62025D Nondisplaced fracture of middle third of navicular [scaphoid] bone of left wrist, subsequent encounter for fracture with routine healing: Secondary | ICD-10-CM

## 2018-03-27 ENCOUNTER — Ambulatory Visit
Admission: RE | Admit: 2018-03-27 | Discharge: 2018-03-27 | Disposition: A | Discharge status: Home / Self Care | Payer: BLUE CROSS/BLUE SHIELD | Source: Ambulatory Visit | Attending: General Surgery | Admitting: General Surgery

## 2018-03-27 DIAGNOSIS — M79652 Pain in left thigh: Secondary | ICD-10-CM | POA: Diagnosis not present

## 2018-03-27 DIAGNOSIS — M792 Neuralgia and neuritis, unspecified: Secondary | ICD-10-CM

## 2018-03-27 MED ORDER — IOPAMIDOL (ISOVUE-300) INJECTION 61%
100.0000 mL | Freq: Once | INTRAVENOUS | Status: AC | PRN
  Administered 2018-03-27: 100 mL via INTRAVENOUS

## 2018-11-11 ENCOUNTER — Other Ambulatory Visit: Payer: Self-pay

## 2018-11-11 ENCOUNTER — Ambulatory Visit (INDEPENDENT_AMBULATORY_CARE_PROVIDER_SITE_OTHER): Payer: BC Managed Care – PPO | Admitting: Family Medicine

## 2018-11-11 ENCOUNTER — Encounter: Payer: Self-pay | Admitting: Family Medicine

## 2018-11-11 VITALS — BP 102/60 | HR 56 | Temp 97.4°F | Ht 70.5 in | Wt 287.0 lb

## 2018-11-11 DIAGNOSIS — M25461 Effusion, right knee: Secondary | ICD-10-CM | POA: Diagnosis not present

## 2018-11-11 MED ORDER — MELOXICAM 15 MG PO TABS: 15.0000 mg | ORAL_TABLET | Freq: Every day | ORAL | 1 refills | Status: DC

## 2018-11-11 NOTE — Patient Instructions (Signed)
Meniscus Tear    A meniscus tear is a knee injury that happens when a piece of the meniscus is torn. The meniscus is a thick, rubbery, wedge-shaped cartilage in the knee. Two menisci are located in each knee. They sit between the upper bone (femur) and lower bone (tibia) that make up the knee joint. Each meniscus acts as a shock absorber for the knee.  A torn meniscus is one of the most common types of knee injuries. This injury can range from mild to severe. Surgery may be needed to repair a severe tear.  What are the causes?  This condition may be caused by any kneeling, squatting, twisting, or pivoting movement. Sports-related injuries are the most common cause. These often occur from:  · Running and stopping suddenly.  ? Changing direction.  ? Being tackled or knocked off your feet.  · Lifting or carrying heavy weights.  As people get older, their menisci get thinner and weaker. In these people, tears can happen more easily, such as from climbing stairs.  What increases the risk?  You are more likely to develop this condition if you:  · Play contact sports.  · Have a job that requires kneeling or squatting.  · Are male.  · Are over 40 years old.  What are the signs or symptoms?  Symptoms of this condition include:  · Knee pain, especially at the side of the knee joint. You may feel pain when the injury occurs, or you may only hear a pop and feel pain later.  · A feeling that your knee is clicking, catching, locking, or giving way.  · Not being able to fully bend or extend your knee.  · Bruising or swelling in your knee.  How is this diagnosed?  This condition may be diagnosed based on your symptoms and a physical exam.  You may also have tests, such as:  · X-rays.  · MRI.  · A procedure to look inside your knee with a narrow surgical telescope (arthroscopy).  You may be referred to a knee specialist (orthopedic surgeon).  How is this treated?  Treatment for this injury depends on the severity of the tear.  Treatment for a mild tear may include:  · Rest.  · Medicine to reduce pain and swelling. This is usually a nonsteroidal anti-inflammatory drug (NSAID), like ibuprofen.  · A knee brace, sleeve, or wrap.  · Using crutches or a walker to keep weight off your knee and to help you walk.  · Exercises to strengthen your knee (physical therapy).  You may need surgery if you have a severe tear or if other treatments are not working.  Follow these instructions at home:  If you have a brace, sleeve, or wrap:  · Wear it as told by your health care provider. Remove it only as told by your health care provider.  · Loosen the brace, sleeve, or wrap if your toes tingle, become numb, or turn cold and blue.  · Keep the brace, sleeve, or wrap clean and dry.  · If the brace, sleeve, or wrap is not waterproof:  ? Do not let it get wet.  ? Cover it with a watertight covering when you take a bath or shower.  Managing pain and swelling    · Take over-the-counter and prescription medicines only as told by your health care provider.  · If directed, put ice on your knee:  ? If you have a removable brace, sleeve, or wrap, remove it   elevate) the injured area above the level of your heart while you are sitting or lying down. Activity  Do not use the injured limb to support your body weight until your health care provider says that you can. Use crutches or a walker as told by your health care provider.  Return to your normal activities as told by your health care provider. Ask your health care provider what activities are safe for you.  Perform range-of-motion exercises only as told by your health care provider.  Begin doing exercises to strengthen your knee and leg muscles only as told by your  health care provider. After you recover, your health care provider may recommend these exercises to help prevent another injury. General instructions  Use a knee brace, sleeve, or wrap as told by your health care provider.  Ask your health care provider when it is safe to drive if you have a brace, sleeve, or wrap on your knee.  Do not use any products that contain nicotine or tobacco, such as cigarettes, e-cigarettes, and chewing tobacco. If you need help quitting, ask your health care provider.  Ask your health care provider if the medicine prescribed to you: ? Requires you to avoid driving or using heavy machinery. ? Can cause constipation. You may need to take these actions to prevent or treat constipation:  Drink enough fluid to keep your urine pale yellow.  Take over-the-counter or prescription medicines.  Eat foods that are high in fiber, such as beans, whole grains, and fresh fruits and vegetables.  Limit foods that are high in fat and processed sugars, such as fried or sweet foods.  Keep all follow-up visits as told by your health care provider. This is important. Contact a health care provider if:  You have a fever.  Your knee becomes red, tender, or swollen.  Your pain medicine is not helping.  Your symptoms get worse or do not improve after 2 weeks of home care. Summary  A meniscus tear is a knee injury that happens when a piece of the meniscus is torn.  Treatment for this injury depends on the severity of the tear. You may need surgery if you have a severe tear or if other treatments are not working.  Rest, ice, and raise (elevate) your injured knee as told by your health care provider. This will help lessen pain and swelling.  Contact a health care provider if you have new symptoms, or your symptoms get worse or do not improve after 2 weeks of home care.  Keep all follow-up visits as told by your health care provider. This is important. This information is not  intended to replace advice given to you by your health care provider. Make sure you discuss any questions you have with your health care provider. Document Released: 03/24/2002 Document Revised: 07/16/2017 Document Reviewed: 07/16/2017 Elsevier Patient Education  2020 [employer].

## 2018-11-11 NOTE — Progress Notes (Signed)
  Subjective:     Patient ID: Mathew Atkinson, male   DOB: [birth year], 48 y.o.   MRN: [id number]  HPI Mathew Atkinson is seen as a work in with 2-week history of right knee pain and swelling.  Denies any specific injury.  His job requires being up on his feet several hours a day.  He has swelling especially at end of day.  Occasional sensation of his knee wanting to give way but no locking.  His pain has been mostly on the medial aspect.  He has been using an elastic brace during the day.  Has been using Aleve 1 every 8 hours without much improvement.  Denies prior history of knee pain.  Has used occasional ice.  No history of gout or pseudogout  No past medical history on file. Past Surgical History:  Procedure Laterality Date  . NO PAST SURGERIES      reports that he has never smoked. He has never used smokeless tobacco. He reports current alcohol use. He reports that he does not use drugs. family history includes Diabetes in his unknown relative; Thyroid disease in his unknown relative. Allergies  Allergen Reactions  . Pollen Extract Other (See Comments)    Nasal irritation     Review of Systems  Constitutional: Negative for chills and fever.  Neurological: Negative for weakness.       Objective:   Physical Exam Constitutional:      Appearance: Normal appearance.  Cardiovascular:     Rate and Rhythm: Normal rate and regular rhythm.  Pulmonary:     Effort: Pulmonary effort is normal.     Breath sounds: Normal breath sounds.  Musculoskeletal:     Comments: Right knee effusion.  No significant warmth.  No erythema.  No ecchymosis.  He has some medial joint line tenderness.  No lateral tenderness.  Ligament testing seems stable.  No patellar tenderness  Neurological:     Mental Status: He is alert.        Assessment:     2-week history of right knee effusion and pain.  Question medial meniscus injury    Plan:     -Meloxicam 15 mg once daily -Icing 15 to 20 minutes at least 2-3 times  daily -Set up sports medicine referral to further evaluate  Eulas Post MD [facility]

## 2018-11-17 ENCOUNTER — Other Ambulatory Visit: Payer: Self-pay

## 2018-11-17 ENCOUNTER — Ambulatory Visit (INDEPENDENT_AMBULATORY_CARE_PROVIDER_SITE_OTHER): Payer: BC Managed Care – PPO

## 2018-11-17 ENCOUNTER — Ambulatory Visit: Payer: Self-pay

## 2018-11-17 ENCOUNTER — Ambulatory Visit (INDEPENDENT_AMBULATORY_CARE_PROVIDER_SITE_OTHER): Payer: BC Managed Care – PPO | Admitting: Family Medicine

## 2018-11-17 ENCOUNTER — Encounter: Payer: Self-pay | Admitting: Family Medicine

## 2018-11-17 VITALS — BP 120/70 | HR 52 | Ht 70.5 in | Wt 291.8 lb

## 2018-11-17 DIAGNOSIS — M25561 Pain in right knee: Secondary | ICD-10-CM | POA: Diagnosis not present

## 2018-11-17 DIAGNOSIS — M1711 Unilateral primary osteoarthritis, right knee: Secondary | ICD-10-CM | POA: Diagnosis not present

## 2018-11-17 DIAGNOSIS — M25461 Effusion, right knee: Secondary | ICD-10-CM

## 2018-11-17 DIAGNOSIS — M1712 Unilateral primary osteoarthritis, left knee: Secondary | ICD-10-CM | POA: Diagnosis not present

## 2018-11-17 LAB — COMPREHENSIVE METABOLIC PANEL
ALT: 27 U/L (ref 0–53)
AST: 23 U/L (ref 0–37)
Albumin: 3.8 g/dL (ref 3.5–5.2)
Alkaline Phosphatase: 62 U/L (ref 39–117)
BUN: 18 mg/dL (ref 6–23)
CO2: 26 mEq/L (ref 19–32)
Calcium: 9.8 mg/dL (ref 8.4–10.5)
Chloride: 105 mEq/L (ref 96–112)
Creatinine, Ser: 1.34 mg/dL (ref 0.40–1.50)
GFR: 68.53 mL/min (ref >60.00)
Glucose, Bld: 84 mg/dL (ref 70–99)
Potassium: 4.7 mEq/L (ref 3.5–5.1)
Sodium: 139 mEq/L (ref 135–145)
Total Bilirubin: 0.4 mg/dL (ref 0.2–1.2)
Total Protein: 6.5 g/dL (ref 6.0–8.3)

## 2018-11-17 LAB — CBC WITH DIFFERENTIAL/PLATELET
Basophils Absolute: 0.1 10*3/uL (ref 0.0–0.1)
Basophils Relative: 0.9 % (ref 0.0–3.0)
Eosinophils Absolute: 0.4 10*3/uL (ref 0.0–0.7)
Eosinophils Relative: 5.8 % — ABNORMAL HIGH (ref 0.0–5.0)
HCT: 44.4 % (ref 39.0–52.0)
Hemoglobin: 14.9 g/dL (ref 13.0–17.0)
Lymphocytes Relative: 27.5 % (ref 12.0–46.0)
Lymphs Abs: 1.7 10*3/uL (ref 0.7–4.0)
MCHC: 33.6 g/dL (ref 30.0–36.0)
MCV: 85.8 fl (ref 78.0–100.0)
Monocytes Absolute: 0.7 10*3/uL (ref 0.1–1.0)
Monocytes Relative: 11.2 % (ref 3.0–12.0)
Neutro Abs: 3.4 10*3/uL (ref 1.4–7.7)
Neutrophils Relative %: 54.6 % (ref 43.0–77.0)
Platelets: 284 10*3/uL (ref 150.0–400.0)
RBC: 5.18 Mil/uL (ref 4.22–5.81)
RDW: 14.2 % (ref 11.5–15.5)
WBC: 6.3 10*3/uL (ref 4.0–10.5)

## 2018-11-17 LAB — URIC ACID: Uric Acid, Serum: 6.4 mg/dL (ref 4.0–7.8)

## 2018-11-17 MED ORDER — DICLOFENAC SODIUM 1 % TD GEL: 4.0000 g | Freq: Four times a day (QID) | TRANSDERMAL | 11 refills | Status: DC

## 2018-11-17 NOTE — Progress Notes (Addendum)
Subjective:    I'm seeing this patient as a consultation for:  Dr. Elease Hashimoto  CC: R knee pain and effusion  I, Wendy Poet, LAT, ATC, am serving as scribe for Dr. Lynne Leader.  HPI: Pt is a 48 y/o male c/o R medial knee pain and effusion.  Pt saw Dr. Elease Hashimoto on 11/11/18.  Pt states that his R knee was hurting x one month and got progressively worse over the last few weeks.  He states that his R knee was popping and swelling.  He states that he purchased a knee brace/sleeve and eventually went to see Dr. Elease Hashimoto who prescribed Meloxicam.  He reports intermittent mechanical symptoms and some instability.  Pt has also used ice which helps some.  Pt rates his pain currently as a 3/10, sharp pain and an 8/10 at it's worst.  Aggravating factors include transitions from sit -to-stand and stationary positions.  He denies any injury.  He notes over time with rest a knee brace in a locked campus pain and swelling have improved a bit.  He denies any history of gout or existing known arthritis.  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  Past medical history, Surgical history, Family history not pertinant except as noted below, Social history, Allergies, and medications have been entered into the medical record, reviewed, and no changes needed.   Review of Systems: No headache, visual changes, nausea, vomiting, diarrhea, constipation, dizziness, abdominal pain, skin rash, fevers, chills, night sweats, weight loss, swollen lymph nodes, body aches, joint swelling, muscle aches, chest pain, shortness of breath, mood changes, visual or auditory hallucinations.   Objective:    Vitals:   11/17/18 1331  BP: 120/70  Pulse: (!) 52  SpO2: 97%   General: Well Developed, well nourished, and in no acute distress.  Neuro/Psych: Alert and oriented x3, extra-ocular muscles intact, able to move all 4 extremities, sensation grossly intact. Skin: Warm and dry, no rashes noted.   Respiratory: Not using accessory muscles, speaking in full sentences, trachea midline.  Cardiovascular: Pulses palpable, no extremity edema. Abdomen: Does not appear distended. MSK:  Right knee: Small effusion otherwise normal-appearing with no erythema or deformity. Mild tender palpation anterior knee with palpable squeak overlying patella tendon. Stable ligamentous exam. Intact strength to extension flexion. Guarding with McMurray's testing test nondiagnostic. Antalgic gait present.  Left knee normal-appearing nontender normal motion no effusion stable ligamentous exam.  Lab and Radiology Results X-ray images right knee obtained today personally independently reviewed Mild to moderate degenerative changes most pronounced in the medial compartment.  No acute fractures or severe DJD. Await formal radiology review  Procedure: Real-time Ultrasound Guided Injection of right knee Device: GE Logiq E   Images permanently stored and available for review in the ultrasound unit. Verbal informed consent obtained.  Discussed risks and benefits of procedure. Warned about infection bleeding damage to structures skin hypopigmentation and fat atrophy among others. Patient expresses understanding and agreement Time-out conducted.   Noted no overlying erythema, induration, or other signs of local infection.   Skin prepped in a sterile fashion.   Local anesthesia: Topical Ethyl chloride.   With sterile technique and under real time ultrasound guidance:  40 mg of Kenalog and 4 mL of Marcaine injected easily.   Completed without difficulty   Pain immediately resolved suggesting accurate placement of the medication.   Advised to call if fevers/chills, erythema, induration, drainage, or persistent bleeding.   Images permanently stored and available for review  in the ultrasound unit.  Impression: Technically successful ultrasound guided injection.       Impression and Recommendations:     Assessment and Plan: 48 y.o. male with right knee pain and effusion.  Differential includes exacerbation of DJD, gout flare, degenerative meniscus tear.  .  Effusion by the time of the visit was small enough that aspiration did not make sense any longer.  Proceeded with simple steroid injection.  However will work-up possible gout with uric acid metabolic panel and CBC.  Treat with ultrasound injection as above and diclofenac gel.  Recheck back if not improving.  Would consider MRI at some point in near future if no improvement.  Cautions reviewed.  PDMP not reviewed this encounter. Orders Placed This Encounter  Procedures  . DG Knee Complete 4 Views Right    Please include patellar sunrise, lateral, and weightbearing bilateral AP and bilateral rosenberg views    Standing Status:   Future    Number of Occurrences:   1    Standing Expiration Date:   01/17/2020    Order Specific Question:   Reason for exam:    Answer:   Please include patellar sunrise, lateral, and weightbearing bilateral AP and bilateral rosenberg views    Comments:   Please include patellar sunrise, lateral, and weightbearing bilateral AP and bilateral rosenberg views    Order Specific Question:   Preferred imaging location?    Answer:   [place]  . DG Knee 1-2 Views Left    Standing Status:   Future    Number of Occurrences:   1    Standing Expiration Date:   01/18/2020    Order Specific Question:   Reason for Exam (SYMPTOM  OR DIAGNOSIS REQUIRED)    Answer:   For use with right knee x-ray, bilateral AP and Rosenberg standing.    Order Specific Question:   Preferred imaging location?    Answer:   [place]  . [place] LIMITED JOINT SPACE STRUCTURES LOW RIGHT    Order Specific Question:   Reason for Exam (SYMPTOM  OR DIAGNOSIS REQUIRED)    Answer:   R knee pain    Order Specific Question:   Preferred imaging location?    Answer:   [place]  . CBC with Differential/Platelet    Standing  Status:   Future    Number of Occurrences:   1    Standing Expiration Date:   11/17/2019  . Comprehensive metabolic panel    Standing Status:   Future    Number of Occurrences:   1    Standing Expiration Date:   11/17/2019  . Uric acid    Standing Status:   Future    Number of Occurrences:   1    Standing Expiration Date:   11/17/2019   Meds ordered this encounter  Medications  . diclofenac sodium (VOLTAREN) 1 % GEL    Sig: Apply 4 g topically 4 (four) times daily. To affected joint.    Dispense:  100 g    Refill:  11    Discussed warning signs or symptoms. Please see discharge instructions. Patient expresses understanding.  The above documentation has been reviewed and is accurate and complete Lynne Leader

## 2018-11-17 NOTE — Patient Instructions (Addendum)
  Thank you for coming in today. Get labs today.  Continue meloxicam as needed.  Use diclofenac gel for pain.  Let me know if not better.    You had an injection today.  Things to be aware of after injection are listed below: . You may experience no significant improvement or even a slight worsening in your symptoms during the first 24 to 48 hours.  After that we expect your symptoms to improve gradually over the next 2 weeks for the medicine to have its maximal effect.  You should continue to have improvement out to 6 weeks after your injection. . Dr. Paulla Fore recommends icing the site of the injection for 20 minutes  1-2 times the day of your injection . You may shower but no swimming, tub bath or Jacuzzi for 24 hours. . If your bandage falls off this does not need to be replaced.  It is appropriate to remove the bandage after 4 hours. . You may resume light activities as tolerated unless otherwise directed per Dr. Paulla Fore during your visit  POSSIBLE STEROID SIDE EFFECTS:  Side effects from injectable steroids tend to be less than when taken orally however you may experience some of the symptoms listed below.  If experienced these should only last for a short period of time. Change in menstrual flow  Edema (swelling)  Increased appetite Skin flushing (redness)  Skin rash/acne  Thrush (oral) Yeast vaginitis    Increased sweating  Depression Increased blood glucose levels Cramping and leg/calf  Euphoria (feeling happy)  POSSIBLE PROCEDURE SIDE EFFECTS: The side effects of the injection are usually fairly minimal however if you may experience some of the following side effects that are usually self-limited and will is off on their own.  If you are concerned please feel free to call the office with questions:  Increased numbness or tingling  Nausea or vomiting  Swelling or bruising at the injection site   Please call our office if if you experience any of the following symptoms over the next week  as these can be signs of infection:   Fever greater than 100.78F  Significant swelling at the injection site  Significant redness or drainage from the injection site  If after 2 weeks you are continuing to have worsening symptoms please call our office to discuss what the next appropriate actions should be including the potential for a return office visit or other diagnostic testing.

## 2018-11-17 NOTE — Progress Notes (Signed)
X-ray right knee shows some mild arthritis.  Some small effusion or fluid is present in the knee.

## 2018-11-18 NOTE — Progress Notes (Signed)
Uric acid is a bit elevated at 6.4.  This does not guarantee that the pain you are having is due to gout but it is suggested that it may be.  If not much improvement after injection reasonable to use colchicine to reduce the system from reacting to the uric acid crystals.  Please keep me updated. Additionally liver and kidney function looks normal however I am routing this to your primary care provider who can review it further if needed.

## 2018-11-19 ENCOUNTER — Telehealth: Payer: Self-pay | Admitting: Family Medicine

## 2018-11-19 NOTE — Telephone Encounter (Signed)
Received fax for PA on Diclofenac sent through cover my meds and received authorization.   Case Id: [id number] Valid: 10/20/18 - 11/19/19  I will notify pharmacy. - CF

## 2019-01-11 DIAGNOSIS — Z8616 Personal history of COVID-19: Secondary | ICD-10-CM

## 2019-01-11 DIAGNOSIS — Z20828 Contact with and (suspected) exposure to other viral communicable diseases: Secondary | ICD-10-CM | POA: Diagnosis not present

## 2019-01-11 HISTORY — DX: Personal history of COVID-19: Z86.16

## 2019-02-19 ENCOUNTER — Other Ambulatory Visit: Payer: Self-pay | Admitting: Family Medicine

## 2019-03-06 ENCOUNTER — Encounter: Payer: Self-pay | Admitting: Family Medicine

## 2019-03-06 ENCOUNTER — Other Ambulatory Visit: Payer: Self-pay

## 2019-03-06 ENCOUNTER — Ambulatory Visit (INDEPENDENT_AMBULATORY_CARE_PROVIDER_SITE_OTHER): Payer: BC Managed Care – PPO | Admitting: Family Medicine

## 2019-03-06 VITALS — BP 120/58 | HR 94 | Temp 97.9°F | Wt 291.4 lb

## 2019-03-06 DIAGNOSIS — M25561 Pain in right knee: Secondary | ICD-10-CM | POA: Diagnosis not present

## 2019-03-06 DIAGNOSIS — G8929 Other chronic pain: Secondary | ICD-10-CM

## 2019-03-06 NOTE — Progress Notes (Signed)
   Subjective:    Patient ID: Mathew Atkinson, male    DOB: 05-22-1970, 49 y.o.   MRN: [id number]  HPI Here for right knee pain that started about 3 months ago. No trauma hx that he can recall. The medial knee gets sharp pains and the entire knee swells. It does not turn red or feel warm to the touch. He is taking Meloxicam and icing it daily. He wears a brace to work. He saw Dr. Dorena Bodo in November and Xrays showed some degenerative changes. He was given an injection of marcaine and kenalog. This helped for about a week, but thene the pain and swelling returned. He had labs drawn that day that included a normal CBC and a normal uric acid level.    Review of Systems  Constitutional: Negative.   Respiratory: Negative.   Cardiovascular: Negative.   Musculoskeletal: Positive for arthralgias.       Objective:   Physical Exam Constitutional:      Appearance: Normal appearance.     Comments: Gait is normal   Cardiovascular:     Rate and Rhythm: Normal rate and regular rhythm.     Pulses: Normal pulses.     Heart sounds: Normal heart sounds.  Pulmonary:     Effort: Pulmonary effort is normal.     Breath sounds: Normal breath sounds.  Musculoskeletal:     Comments: Right knee is slightly swollen. Not red or warm. ROM is full. No crepitus. He is tender along the medial joint space   Neurological:     Mental Status: He is alert.           Assessment & Plan:  Right knee pain, I suspect he has a meniscal tear. We will refer him to Orthopedics to evaluate further.  Alysia Penna, MD

## 2019-03-13 ENCOUNTER — Encounter: Payer: Self-pay | Admitting: Orthopaedic Surgery

## 2019-03-13 ENCOUNTER — Ambulatory Visit (INDEPENDENT_AMBULATORY_CARE_PROVIDER_SITE_OTHER): Payer: BC Managed Care – PPO | Admitting: Orthopaedic Surgery

## 2019-03-13 ENCOUNTER — Other Ambulatory Visit: Payer: Self-pay

## 2019-03-13 DIAGNOSIS — G8929 Other chronic pain: Secondary | ICD-10-CM | POA: Diagnosis not present

## 2019-03-13 DIAGNOSIS — M25561 Pain in right knee: Secondary | ICD-10-CM

## 2019-03-13 NOTE — Progress Notes (Signed)
Office Visit Note   Patient: Mathew Atkinson           Date of Birth: 02-12-1970           MRN: [id number] Visit Date: 03/13/2019              Requested by: Laurey Morale, MD [place],   [id number] PCP: Laurey Morale, MD   Assessment & Plan: Visit Diagnoses:  1. Chronic pain of right knee     Plan: My impression is right knee medial meniscal tear.  Cortisone injection failed to give relief.  At this point we will need an MRI to fully evaluate for the suspected medial meniscal tear.  Follow-up in about 10 to 14 days.  Follow-Up Instructions: Return for follow up 10-14 for MRI review.   Orders:  Orders Placed This Encounter  Procedures  . MR Knee Right w/o contrast   No orders of the defined types were placed in this encounter.     Procedures: No procedures performed   Clinical Data: No additional findings.   Subjective: Chief Complaint  Patient presents with  . Right Knee - Pain    Mathew Atkinson is a 49 year old gentleman comes in for evaluation of chronic right knee pain for the last 3 to 4 months.  He denies any injuries.  He originally [facility] and received a cortisone injection which helped for about 2 weeks.  He continues to have sharp stabbing pain on the medial side of the knee without catching or locking.  There is popping with range of motion.  He is also tried wearing a knee brace and meloxicam and and Aleve which does not help significantly.  He is a referral from Dr. Sarajane Jews for concern of medial meniscus tear.   Review of Systems  Constitutional: Negative.   All other systems reviewed and are negative.    Objective: Vital Signs: There were no vitals taken for this visit.  Physical Exam Vitals and nursing note reviewed.  Constitutional:      Appearance: He is well-developed.  HENT:     Head: Normocephalic and atraumatic.  Eyes:     Pupils: Pupils are equal, round, and reactive to light.  Pulmonary:    Effort: Pulmonary effort is normal.  Abdominal:     Palpations: Abdomen is soft.  Musculoskeletal:        General: Normal range of motion.     Cervical back: Neck supple.  Skin:    General: Skin is warm.  Neurological:     Mental Status: He is alert and oriented to person, place, and time.  Psychiatric:        Behavior: Behavior normal.        Thought Content: Thought content normal.        Judgment: Judgment normal.     Ortho Exam Right knee exam shows a trace joint effusion.  Pain with palpation of the medial joint line and pain with McMurray test.  Positive Thessaly.  Collaterals and cruciates are stable.  Normal range of motion without pain.  No palpable popliteal masses. Specialty Comments:  No specialty comments available.  Imaging: No results found.   PMFS History: Patient Active Problem List   Diagnosis Date Noted  . Chronic pain of right knee 03/06/2019  . Plantar fasciitis, left 12/27/2015   History reviewed. No pertinent past medical history.  Family History  Problem Relation Age of Onset  . Diabetes Other   .  Thyroid disease Other     Past Surgical History:  Procedure Laterality Date  . NO PAST SURGERIES     Social History   Occupational History  . Not on file  Tobacco Use  . Smoking status: Never Smoker  . Smokeless tobacco: Never Used  Substance and Sexual Activity  . Alcohol use: Yes    Comment: once or twice a month  . Drug use: No  . Sexual activity: Not on file

## 2019-03-25 ENCOUNTER — Encounter: Payer: Self-pay | Admitting: Orthopaedic Surgery

## 2019-03-25 DIAGNOSIS — R600 Localized edema: Secondary | ICD-10-CM | POA: Diagnosis not present

## 2019-03-25 DIAGNOSIS — M23331 Other meniscus derangements, other medial meniscus, right knee: Secondary | ICD-10-CM | POA: Diagnosis not present

## 2019-03-25 DIAGNOSIS — M23361 Other meniscus derangements, other lateral meniscus, right knee: Secondary | ICD-10-CM | POA: Diagnosis not present

## 2019-03-25 DIAGNOSIS — M23321 Other meniscus derangements, posterior horn of medial meniscus, right knee: Secondary | ICD-10-CM | POA: Diagnosis not present

## 2019-03-27 ENCOUNTER — Ambulatory Visit (INDEPENDENT_AMBULATORY_CARE_PROVIDER_SITE_OTHER): Payer: BC Managed Care – PPO | Admitting: Orthopaedic Surgery

## 2019-03-27 ENCOUNTER — Other Ambulatory Visit: Payer: Self-pay

## 2019-03-27 ENCOUNTER — Encounter (HOSPITAL_BASED_OUTPATIENT_CLINIC_OR_DEPARTMENT_OTHER): Payer: Self-pay | Admitting: Orthopaedic Surgery

## 2019-03-27 ENCOUNTER — Encounter: Payer: Self-pay | Admitting: Orthopaedic Surgery

## 2019-03-27 DIAGNOSIS — S83241A Other tear of medial meniscus, current injury, right knee, initial encounter: Secondary | ICD-10-CM | POA: Diagnosis not present

## 2019-03-27 NOTE — Progress Notes (Signed)
   Office Visit Note   Patient: Mathew Atkinson           Date of Birth: 03-13-1970           MRN: [id number] Visit Date: 03/27/2019              Requested by: Laurey Morale, MD [place],  [place] [id number] PCP: Laurey Morale, MD   Assessment & Plan: Visit Diagnoses:  1. Acute medial meniscus tear of right knee, initial encounter     Plan: MRI of the right knee shows an acute medial meniscal tear of the posterior horn and mid body.  He has medial compartment chondromalacia as well.  Given these findings and lack of pain relief from conservative treatment the patient has elected to proceed with arthroscopic partial medial meniscectomy and chondroplasty as indicated.  Risk benefits and rehab and recovery were reviewed in detail today.  We will schedule the patient for surgery in the near future.  Follow-Up Instructions: Return for 1 week postop visit.   Orders:  No orders of the defined types were placed in this encounter.  No orders of the defined types were placed in this encounter.     Procedures: No procedures performed   Clinical Data: No additional findings.   Subjective: Chief Complaint  Patient presents with  . Right Knee - Pain, Follow-up    Chrystopher returns today for MRI review of the right knee.  He is barely able to work due to the pain and the swelling.  He has a lot of difficulty walking.   Review of Systems  Constitutional: Negative.   All other systems reviewed and are negative.    Objective: Vital Signs: There were no vitals taken for this visit.  Physical Exam Vitals and nursing note reviewed.  Constitutional:      Appearance: He is well-developed.  Pulmonary:     Effort: Pulmonary effort is normal.  Abdominal:     Palpations: Abdomen is soft.  Skin:    General: Skin is warm.  Neurological:     Mental Status: He is alert and oriented to person, place, and time.  Psychiatric:        Behavior: Behavior normal.        Thought  Content: Thought content normal.        Judgment: Judgment normal.     Ortho Exam Right knee exam shows pain with range of motion.  Medial joint line tenderness. Specialty Comments:  No specialty comments available.  Imaging: No results found.   PMFS History: Patient Active Problem List   Diagnosis Date Noted  . Acute medial meniscus tear of right knee 03/27/2019  . Chronic pain of right knee 03/06/2019  . Plantar fasciitis, left 12/27/2015   History reviewed. No pertinent past medical history.  Family History  Problem Relation Age of Onset  . Diabetes Other   . Thyroid disease Other     Past Surgical History:  Procedure Laterality Date  . NO PAST SURGERIES     Social History   Occupational History  . Not on file  Tobacco Use  . Smoking status: Never Smoker  . Smokeless tobacco: Never Used  Substance and Sexual Activity  . Alcohol use: Yes    Comment: once or twice a month  . Drug use: No  . Sexual activity: Not on file

## 2019-04-01 NOTE — Progress Notes (Signed)

## 2019-04-02 NOTE — Anesthesia Preprocedure Evaluation (Addendum)
Anesthesia Evaluation  Patient identified by MRN, date of birth, ID band Patient awake    Reviewed: Allergy & Precautions, H&P , NPO status , Patient's Chart, lab work & pertinent test results  Airway Mallampati: II  TM Distance: >3 FB Neck ROM: Full    Dental no notable dental hx. (+) Dental Advisory Given, Teeth Intact   Pulmonary neg pulmonary ROS,    Pulmonary exam normal breath sounds clear to auscultation       Cardiovascular Exercise Tolerance: Good negative cardio ROS Normal cardiovascular exam Rhythm:Regular Rate:Normal     Neuro/Psych negative neurological ROS  negative psych ROS   GI/Hepatic negative GI ROS, Neg liver ROS,   Endo/Other  negative endocrine ROS  Renal/GU      Musculoskeletal negative musculoskeletal ROS (+)   Abdominal (+) + obese,   Peds  Hematology negative hematology ROS (+)   Anesthesia Other Findings   Reproductive/Obstetrics                            Anesthesia Physical Anesthesia Plan  ASA: III  Anesthesia Plan: General   Post-op Pain Management:    Induction: Intravenous  PONV Risk Score and Plan: 3 and Treatment may vary due to age or medical condition, Ondansetron, Dexamethasone and Midazolam  Airway Management Planned: LMA  Additional Equipment: None  Intra-op Plan:   Post-operative Plan:   Informed Consent: I have reviewed the patients History and Physical, chart, labs and discussed the procedure including the risks, benefits and alternatives for the proposed anesthesia with the patient or authorized representative who has indicated his/her understanding and acceptance.     Dental advisory given  Plan Discussed with: CRNA  Anesthesia Plan Comments:        Anesthesia Quick Evaluation

## 2019-04-03 ENCOUNTER — Encounter: Payer: Self-pay | Admitting: Orthopaedic Surgery

## 2019-04-03 ENCOUNTER — Other Ambulatory Visit: Payer: Self-pay

## 2019-04-03 ENCOUNTER — Ambulatory Visit (HOSPITAL_BASED_OUTPATIENT_CLINIC_OR_DEPARTMENT_OTHER): Payer: BC Managed Care – PPO | Admitting: Anesthesiology

## 2019-04-03 ENCOUNTER — Ambulatory Visit (HOSPITAL_BASED_OUTPATIENT_CLINIC_OR_DEPARTMENT_OTHER)
Admission: RE | Admit: 2019-04-03 | Discharge: 2019-04-03 | Disposition: A | Discharge status: Home / Self Care | Payer: BC Managed Care – PPO | Attending: Orthopaedic Surgery | Admitting: Orthopaedic Surgery

## 2019-04-03 ENCOUNTER — Encounter (HOSPITAL_BASED_OUTPATIENT_CLINIC_OR_DEPARTMENT_OTHER)
Admission: RE | Disposition: A | Discharge status: Home / Self Care | Payer: Self-pay | Source: Home / Self Care | Attending: Orthopaedic Surgery

## 2019-04-03 ENCOUNTER — Encounter (HOSPITAL_BASED_OUTPATIENT_CLINIC_OR_DEPARTMENT_OTHER): Payer: Self-pay | Admitting: Orthopaedic Surgery

## 2019-04-03 DIAGNOSIS — X58XXXA Exposure to other specified factors, initial encounter: Secondary | ICD-10-CM | POA: Diagnosis not present

## 2019-04-03 DIAGNOSIS — M659 Synovitis and tenosynovitis, unspecified: Secondary | ICD-10-CM

## 2019-04-03 DIAGNOSIS — Z6841 Body Mass Index (BMI) 40.0 and over, adult: Secondary | ICD-10-CM | POA: Diagnosis not present

## 2019-04-03 DIAGNOSIS — Z8616 Personal history of COVID-19: Secondary | ICD-10-CM | POA: Diagnosis not present

## 2019-04-03 DIAGNOSIS — S83241A Other tear of medial meniscus, current injury, right knee, initial encounter: Secondary | ICD-10-CM | POA: Insufficient documentation

## 2019-04-03 DIAGNOSIS — S83281A Other tear of lateral meniscus, current injury, right knee, initial encounter: Secondary | ICD-10-CM | POA: Diagnosis not present

## 2019-04-03 DIAGNOSIS — Z791 Long term (current) use of non-steroidal anti-inflammatories (NSAID): Secondary | ICD-10-CM | POA: Diagnosis not present

## 2019-04-03 DIAGNOSIS — S83231A Complex tear of medial meniscus, current injury, right knee, initial encounter: Secondary | ICD-10-CM | POA: Diagnosis not present

## 2019-04-03 DIAGNOSIS — E669 Obesity, unspecified: Secondary | ICD-10-CM | POA: Diagnosis not present

## 2019-04-03 HISTORY — PX: KNEE ARTHROSCOPY WITH MEDIAL MENISECTOMY: SHX5651

## 2019-04-03 SURGERY — ARTHROSCOPY, KNEE, WITH MEDIAL MENISCECTOMY
Anesthesia: General | Site: Knee | Laterality: Right

## 2019-04-03 MED ORDER — CELECOXIB 200 MG PO CAPS
200.0000 mg | ORAL_CAPSULE | Freq: Once | ORAL | Status: AC
  Administered 2019-04-03: 200 mg via ORAL

## 2019-04-03 MED ORDER — DEXAMETHASONE SODIUM PHOSPHATE 10 MG/ML IJ SOLN
INTRAMUSCULAR | Status: DC | PRN
  Administered 2019-04-03: 10 mg via INTRAVENOUS

## 2019-04-03 MED ORDER — ARTIFICIAL TEARS OPHTHALMIC OINT
TOPICAL_OINTMENT | OPHTHALMIC | Status: AC
  Filled 2019-04-03: qty 3.5

## 2019-04-03 MED ORDER — GLYCOPYRROLATE 0.2 MG/ML IJ SOLN
INTRAMUSCULAR | Status: DC | PRN
  Administered 2019-04-03: .2 mg via INTRAVENOUS

## 2019-04-03 MED ORDER — MIDAZOLAM HCL 2 MG/2ML IJ SOLN
INTRAMUSCULAR | Status: AC
  Filled 2019-04-03: qty 2

## 2019-04-03 MED ORDER — MIDAZOLAM HCL 2 MG/2ML IJ SOLN
INTRAMUSCULAR | Status: DC | PRN
  Administered 2019-04-03: 2 mg via INTRAVENOUS

## 2019-04-03 MED ORDER — LACTATED RINGERS IV SOLN: INTRAVENOUS | Status: DC

## 2019-04-03 MED ORDER — FENTANYL CITRATE (PF) 100 MCG/2ML IJ SOLN
INTRAMUSCULAR | Status: AC
  Filled 2019-04-03: qty 2

## 2019-04-03 MED ORDER — ACETAMINOPHEN 500 MG PO TABS
ORAL_TABLET | ORAL | Status: AC
  Filled 2019-04-03: qty 1

## 2019-04-03 MED ORDER — CEFAZOLIN SODIUM-DEXTROSE 1-4 GM/50ML-% IV SOLN
INTRAVENOUS | Status: AC
  Filled 2019-04-03: qty 50

## 2019-04-03 MED ORDER — FENTANYL CITRATE (PF) 100 MCG/2ML IJ SOLN: 50.0000 ug | INTRAMUSCULAR | Status: DC | PRN

## 2019-04-03 MED ORDER — PROPOFOL 10 MG/ML IV BOLUS
INTRAVENOUS | Status: DC | PRN
  Administered 2019-04-03: 200 mg via INTRAVENOUS

## 2019-04-03 MED ORDER — FENTANYL CITRATE (PF) 100 MCG/2ML IJ SOLN
INTRAMUSCULAR | Status: DC | PRN
  Administered 2019-04-03: 100 ug via INTRAVENOUS

## 2019-04-03 MED ORDER — HYDROCODONE-ACETAMINOPHEN 5-325 MG PO TABS: 1.0000 | ORAL_TABLET | Freq: Three times a day (TID) | ORAL | 0 refills | Status: DC | PRN

## 2019-04-03 MED ORDER — BUPIVACAINE HCL (PF) 0.25 % IJ SOLN
INTRAMUSCULAR | Status: DC | PRN
  Administered 2019-04-03: 20 mL

## 2019-04-03 MED ORDER — ONDANSETRON HCL 4 MG/2ML IJ SOLN
INTRAMUSCULAR | Status: DC | PRN
  Administered 2019-04-03: 4 mg via INTRAVENOUS

## 2019-04-03 MED ORDER — ARTIFICIAL TEARS OPHTHALMIC OINT
TOPICAL_OINTMENT | OPHTHALMIC | Status: DC | PRN
  Administered 2019-04-03: 1 via OPHTHALMIC

## 2019-04-03 MED ORDER — CELECOXIB 200 MG PO CAPS
ORAL_CAPSULE | ORAL | Status: AC
  Filled 2019-04-03: qty 1

## 2019-04-03 MED ORDER — CEFAZOLIN SODIUM-DEXTROSE 2-4 GM/100ML-% IV SOLN
INTRAVENOUS | Status: AC
  Filled 2019-04-03: qty 100

## 2019-04-03 MED ORDER — KETOROLAC TROMETHAMINE 30 MG/ML IJ SOLN: 30.0000 mg | Freq: Once | INTRAMUSCULAR | Status: DC | PRN

## 2019-04-03 MED ORDER — ONDANSETRON HCL 4 MG/2ML IJ SOLN
INTRAMUSCULAR | Status: AC
  Filled 2019-04-03: qty 2

## 2019-04-03 MED ORDER — ACETAMINOPHEN 500 MG PO TABS
1000.0000 mg | ORAL_TABLET | Freq: Once | ORAL | Status: AC
  Administered 2019-04-03: 1000 mg via ORAL

## 2019-04-03 MED ORDER — DEXTROSE 5 % IV SOLN
3.0000 g | INTRAVENOUS | Status: AC
  Administered 2019-04-03: 12:00:00 3 g via INTRAVENOUS

## 2019-04-03 MED ORDER — PROPOFOL 10 MG/ML IV BOLUS
INTRAVENOUS | Status: AC
  Filled 2019-04-03: qty 20

## 2019-04-03 MED ORDER — CHLORHEXIDINE GLUCONATE 4 % EX LIQD: 60.0000 mL | Freq: Once | CUTANEOUS | Status: DC

## 2019-04-03 MED ORDER — MIDAZOLAM HCL 2 MG/2ML IJ SOLN: 1.0000 mg | INTRAMUSCULAR | Status: DC | PRN

## 2019-04-03 MED ORDER — LIDOCAINE 2% (20 MG/ML) 5 ML SYRINGE
INTRAMUSCULAR | Status: AC
  Filled 2019-04-03: qty 5

## 2019-04-03 MED ORDER — HYDROMORPHONE HCL 1 MG/ML IJ SOLN: 0.2500 mg | INTRAMUSCULAR | Status: DC | PRN

## 2019-04-03 MED ORDER — OXYCODONE HCL 5 MG PO TABS: 5.0000 mg | ORAL_TABLET | Freq: Once | ORAL | Status: DC | PRN

## 2019-04-03 MED ORDER — DEXAMETHASONE SODIUM PHOSPHATE 10 MG/ML IJ SOLN
INTRAMUSCULAR | Status: AC
  Filled 2019-04-03: qty 1

## 2019-04-03 MED ORDER — ONDANSETRON HCL 4 MG/2ML IJ SOLN: 4.0000 mg | Freq: Once | INTRAMUSCULAR | Status: DC | PRN

## 2019-04-03 MED ORDER — OXYCODONE HCL 5 MG/5ML PO SOLN: 5.0000 mg | Freq: Once | ORAL | Status: DC | PRN

## 2019-04-03 MED ORDER — GLYCOPYRROLATE PF 0.2 MG/ML IJ SOSY
PREFILLED_SYRINGE | INTRAMUSCULAR | Status: AC
  Filled 2019-04-03: qty 1

## 2019-04-03 MED ORDER — SODIUM CHLORIDE 0.9 % IR SOLN
Status: DC | PRN
  Administered 2019-04-03: 3000 mL

## 2019-04-03 MED ORDER — LIDOCAINE HCL (CARDIAC) PF 100 MG/5ML IV SOSY
PREFILLED_SYRINGE | INTRAVENOUS | Status: DC | PRN
  Administered 2019-04-03: 100 mg via INTRATRACHEAL

## 2019-04-03 SURGICAL SUPPLY — 34 items
BANDAGE ESMARK 6X9 LF (GAUZE/BANDAGES/DRESSINGS) IMPLANT
BLADE SHAVER TORPEDO 4X13 (MISCELLANEOUS) IMPLANT
BNDG CMPR 9X6 STRL LF SNTH (GAUZE/BANDAGES/DRESSINGS)
BNDG ELASTIC 6X5.8 VLCR STR LF (GAUZE/BANDAGES/DRESSINGS) ×4 IMPLANT
BNDG ESMARK 6X9 LF (GAUZE/BANDAGES/DRESSINGS)
COVER WAND RF STERILE (DRAPES) IMPLANT
CUFF TOURN SGL QUICK 34 (TOURNIQUET CUFF) ×2
CUFF TRNQT CYL 34X4.125X (TOURNIQUET CUFF) ×1 IMPLANT
DRAPE ARTHROSCOPY W/POUCH 90 (DRAPES) ×2 IMPLANT
DRAPE IMP U-DRAPE 54X76 (DRAPES) ×2 IMPLANT
DRAPE U-SHAPE 47X51 STRL (DRAPES) ×2 IMPLANT
DURAPREP 26ML APPLICATOR (WOUND CARE) ×2 IMPLANT
GAUZE SPONGE 4X4 12PLY STRL (GAUZE/BANDAGES/DRESSINGS) ×2 IMPLANT
GAUZE XEROFORM 1X8 LF (GAUZE/BANDAGES/DRESSINGS) ×2 IMPLANT
GLOVE BIOGEL PI IND STRL 7.0 (GLOVE) ×1 IMPLANT
GLOVE BIOGEL PI INDICATOR 7.0 (GLOVE) ×1
GLOVE ECLIPSE 7.0 STRL STRAW (GLOVE) ×2 IMPLANT
GLOVE SKINSENSE NS SZ7.5 (GLOVE) ×1
GLOVE SKINSENSE STRL SZ7.5 (GLOVE) ×1 IMPLANT
GLOVE SURG SYN 7.5  E (GLOVE) ×2
GLOVE SURG SYN 7.5 E (GLOVE) ×1 IMPLANT
GLOVE SURG SYN 7.5 PF PI (GLOVE) ×1 IMPLANT
GOWN STRL REIN XL XLG (GOWN DISPOSABLE) ×2 IMPLANT
GOWN STRL REUS W/ TWL LRG LVL3 (GOWN DISPOSABLE) ×1 IMPLANT
GOWN STRL REUS W/ TWL XL LVL3 (GOWN DISPOSABLE) ×1 IMPLANT
GOWN STRL REUS W/TWL LRG LVL3 (GOWN DISPOSABLE) ×2
GOWN STRL REUS W/TWL XL LVL3 (GOWN DISPOSABLE) ×2
KNEE WRAP E Z 3 GEL PACK (MISCELLANEOUS) ×2 IMPLANT
MANIFOLD NEPTUNE II (INSTRUMENTS) ×2 IMPLANT
PACK ARTHROSCOPY DSU (CUSTOM PROCEDURE TRAY) ×2 IMPLANT
PACK BASIN DAY SURGERY FS (CUSTOM PROCEDURE TRAY) ×2 IMPLANT
SUT ETHILON 3 0 PS 1 (SUTURE) ×2 IMPLANT
TOWEL GREEN STERILE FF (TOWEL DISPOSABLE) ×2 IMPLANT
TUBING ARTHROSCOPY IRRIG 16FT (MISCELLANEOUS) ×2 IMPLANT

## 2019-04-03 NOTE — Discharge Instructions (Signed)
  Post-operative patient instructions  Knee Arthroscopy   . Ice:  Place intermittent ice or cooler pack over your knee, 30 minutes on and 30 minutes off.  Continue this for the first 72 hours after surgery, then save ice for use after therapy sessions or on more active days.   . Weight:  You may bear weight on your leg as your symptoms allow. . Crutches:  Use crutches (or walker) to assist in walking until told to discontinue by your physical therapist or physician. This will help to reduce pain. . Strengthening:  Perform simple thigh squeezes (isometric quad contractions) and straight leg lifts as you are able (3 sets of 5 to 10 repetitions, 3 times a day).  For the leg lifts, have someone support under your ankle in the beginning until you have increased strength enough to do this on your own.  To help get started on thigh squeezes, place a pillow under your knee and push down on the pillow with back of knee (sometimes easier to do than with your leg fully straight). . Motion:  Perform gentle knee motion as tolerated - this is gentle bending and straightening of the knee. Seated heel slides: you can start by sitting in a chair, remove your brace, and gently slide your heel back on the floor - allowing your knee to bend. Have someone help you straighten your knee (or use your other leg/foot hooked under your ankle.  . Dressing:  Perform 1st dressing change at 2 days postoperative. A moderate amount of blood tinged drainage is to be expected.  So if you bleed through the dressing on the first or second day or if you have fevers, it is fine to change the dressing/check the wounds early and redress wound. Elevate your leg.  If it bleeds through again, or if the incisions are leaking frank blood, please call the office. May change dressing every 1-2 days thereafter to help watch wounds. Can purchase Tegaderm (or 3M Nexcare) water resistant dressings at local pharmacy / [employer]. . Shower:  Light shower is  ok after 2 days.  Please take shower, NO bath. Recover with gauze and ace wrap to help keep wounds protected.   . Pain medication:  A narcotic pain medication has been prescribed.  Take as directed.  Typically you need narcotic pain medication more regularly during the first 3 to 5 days after surgery.  Decrease your use of the medication as the pain improves.  Narcotics can sometimes cause constipation, even after a few doses.  If you have problems with constipation, you can take an over the counter stool softener or light laxative.  If you have persistent problems, please notify your physician's office. . Physical therapy: Additional activity guidelines to be provided by your physician or physical therapist at follow-up visits.  . Driving: Do not recommend driving x 2 weeks post surgical, especially if surgery performed on right side. Should not drive while taking narcotic pain medications. It typically takes at least 2 weeks to restore sufficient neuromuscular function for normal reaction times for driving safety.  . Call [phone] for questions or problems. Evenings you will be forwarded to the hospital operator.  Ask for the orthopaedic physician on call. Please call if you experience:    o Redness, foul smelling, or persistent drainage from the surgical site  o worsening knee pain and swelling not responsive to medication  o any calf pain and or swelling of the lower leg  o temperatures greater than   101.5 F o other questions or concerns   Thank you for allowing [place] to be a part of your care.    NO TYLENOL PRODUCTS UNTIL 6:00 PM  NO ADVIL/ MOTRIN UNTIL 7:30 PM     Post Anesthesia Home Care Instructions  Activity: Get plenty of rest for the remainder of the day. A responsible individual must stay with you for 24 hours following the procedure.  For the next 24 hours, DO NOT: -Drive a car -Paediatric nurse -Drink alcoholic beverages -Take any medication unless instructed by your  physician -Make any legal decisions or sign important papers.  Meals: Start with liquid foods such as gelatin or soup. Progress to regular foods as tolerated. Avoid greasy, spicy, heavy foods. If nausea and/or vomiting occur, drink only clear liquids until the nausea and/or vomiting subsides. Call your physician if vomiting continues.  Special Instructions/Symptoms: Your throat may feel dry or sore from the anesthesia or the breathing tube placed in your throat during surgery. If this causes discomfort, gargle with warm salt water. The discomfort should disappear within 24 hours.  If you had a scopolamine patch placed behind your ear for the management of post- operative nausea and/or vomiting:  1. The medication in the patch is effective for 72 hours, after which it should be removed.  Wrap patch in a tissue and discard in the trash. Wash hands thoroughly with soap and water. 2. You may remove the patch earlier than 72 hours if you experience unpleasant side effects which may include dry mouth, dizziness or visual disturbances. 3. Avoid touching the patch. Wash your hands with soap and water after contact with the patch.

## 2019-04-03 NOTE — Anesthesia Procedure Notes (Signed)
Procedure Name: LMA Insertion Date/Time: 04/03/2019 12:22 PM Performed by: Collier Bullock, CRNA Pre-anesthesia Checklist: Patient identified, Emergency Drugs available, Suction available and Patient being monitored Patient Re-evaluated:Patient Re-evaluated prior to induction Oxygen Delivery Method: Circle system utilized Preoxygenation: Pre-oxygenation with 100% oxygen Induction Type: IV induction LMA: LMA inserted LMA Size: 5.0 Number of attempts: 1 Placement Confirmation: positive ETCO2 and breath sounds checked- equal and bilateral Tube secured with: Tape Dental Injury: Teeth and Oropharynx as per pre-operative assessment and Injury to lip

## 2019-04-03 NOTE — Op Note (Signed)
   Surgery Date: 04/03/2019  PREOPERATIVE DIAGNOSES:  1.  Right knee medial and lateral meniscus tear 2.  Right knee synovitis  POSTOPERATIVE DIAGNOSES:  same  PROCEDURES PERFORMED:  1.  Right knee arthroscopy with major synovectomy 2.  Right knee arthroscopy with arthroscopic partial medial and lateral meniscectomy 3.  Right knee arthroscopy with arthroscopic chondroplasty medial femoral condyle and medial tibial plateau.  Surgeon(s): Leandrew Koyanagi, MD  ASSIST: Madalyn Rob, PA-C; necessary for the timely completion of procedure and due to complexity of procedure.  ANESTHESIA:  general  FLUIDS: Per anesthesia record.   ESTIMATED BLOOD LOSS: minimal  DESCRIPTION OF PROCEDURE: Mathew Atkinson is a 49 y.o.-year-old male with right knee medial and lateral meniscus tear. Plans are to proceed with partial medial and lateral meniscectomy and diagnostic arthroscopy with debridement as indicated. Full discussion held regarding risks benefits alternatives and complications related surgical intervention. Conservative care options reviewed. All questions answered.  The patient was identified in the preoperative holding area and the operative extremity was marked. The patient was brought to the operating room and transferred to operating table in a supine position. Satisfactory general anesthesia was induced by anesthesiology.    Standard anterolateral, anteromedial arthroscopy portals were obtained. The anteromedial portal was obtained with a spinal needle for localization under direct visualization with subsequent diagnostic findings.   Incisions were made for arthroscopic knee portals.  Diagnostic knee arthroscopy was first performed along with a major synovectomy in all 3 knee compartments with oscillating shaver.  We then position the knee and a gentle valgus and placed the arthroscope into the medial compartment which showed grade III chondromalacia.  We then used a probe to identify the  complex tear of the posterior horn the medial meniscus.  Partial medial meniscectomy was performed using meniscus basket and shaver back to a stable border.  We took approximately 25% of the posterior horn volume.  The cruciates were unremarkable.  Chondroplasty was performed from the medial femoral condyle.  The knee was then placed in a figure 4 position and we found a horizontal cleavage tear of the junction of the posterior horn and mid body of the lateral meniscus.  Partial lateral meniscectomy was performed with oscillating shaver and meniscus basket back to stable border.  We then placed the knee in full extension and the patellofemoral compartment showed grade II chondromalacia.  Gutters were checked for loose bodies.  Incisions were closed with interrupted sutures.  Sterile dressings were applied.  Patient tolerated procedure well no immediate complications.  Suprapatellar pouch and gutters: moderate synovitis or debris. Patella chondral surface: Grade 2 Trochlear chondral surface: Grade 2 Patellofemoral tracking: normal Medial meniscus: complex tear posterior horn.  Medial femoral condyle weight bearing surface: Grade 3 Medial tibial plateau: Grade 3 Anterior cruciate ligament:stable Posterior cruciate ligament:stable Lateral meniscus: horizontal cleavage tear.   Lateral femoral condyle weight bearing surface: Grade 1 Lateral tibial plateau: Grade 1  DISPOSITION: The patient was awakened from general anesthetic, extubated, taken to the recovery room in medically stable condition, no apparent complications. The patient may be weightbearing as tolerated to the operative lower extremity.  Range of motion of right knee as tolerated.  Azucena Cecil, MD [facility] 1:03 PM

## 2019-04-03 NOTE — H&P (Signed)
    PREOPERATIVE H&P  Chief Complaint: right knee medial meniscal tear  HPI: Mathew Atkinson is a 49 y.o. male who presents for surgical treatment of right knee medial meniscal tear.  He denies any changes in medical history.  Past Medical History:  Diagnosis Date  . History of COVID-19 01/11/2019   Past Surgical History:  Procedure Laterality Date  . NO PAST SURGERIES     Social History   Socioeconomic History  . Marital status: Married    Spouse name: Not on file  . Number of children: Not on file  . Years of education: Not on file  . Highest education level: Not on file  Occupational History  . Not on file  Tobacco Use  . Smoking status: Never Smoker  . Smokeless tobacco: Never Used  Substance and Sexual Activity  . Alcohol use: Yes    Comment: once or twice a month  . Drug use: No  . Sexual activity: Not on file  Other Topics Concern  . Not on file  Social History Narrative   Married   Regular exercise- no   Social Determinants of Health   Financial Resource Strain:   . Difficulty of Paying Living Expenses:   Food Insecurity:   . Worried About Charity fundraiser in the Last Year:   . Arboriculturist in the Last Year:   Transportation Needs:   . Film/video editor (Medical):   Marland Kitchen Lack of Transportation (Non-Medical):   Physical Activity:   . Days of Exercise per Week:   . Minutes of Exercise per Session:   Stress:   . Feeling of Stress :   Social Connections:   . Frequency of Communication with Friends and Family:   . Frequency of Social Gatherings with Friends and Family:   . Attends Religious Services:   . Active Member of Clubs or Organizations:   . Attends Archivist Meetings:   Marland Kitchen Marital Status:    Family History  Problem Relation Age of Onset  . Diabetes Other   . Thyroid disease Other    Allergies  Allergen Reactions  . Pollen Extract Other (See Comments)    Nasal irritation   Prior to Admission medications   Medication Sig  Start Date End Date Taking? Authorizing Provider  meloxicam (MOBIC) 15 MG tablet TAKE 1 TABLET(15 MG) BY MOUTH DAILY 02/20/19  Yes Laurey Morale, MD  diclofenac sodium (VOLTAREN) 1 % GEL Apply 4 g topically 4 (four) times daily. To affected joint. 11/17/18   [name], MD     Positive ROS: All other systems have been reviewed and were otherwise negative with the exception of those mentioned in the HPI and as above.  Physical Exam: General: Alert, no acute distress Cardiovascular: No pedal edema Respiratory: No cyanosis, no use of accessory musculature GI: abdomen soft Skin: No lesions in the area of chief complaint Neurologic: Sensation intact distally Psychiatric: Patient is competent for consent with normal mood and affect Lymphatic: no lymphedema  MUSCULOSKELETAL: exam stable  Assessment: right knee medial meniscal tear  Plan: Plan for Procedure(s): RIGHT KNEE ARTHROSCOPY WITH PARTIAL MEDIAL MENISCECTOMY  The risks benefits and alternatives were discussed with the patient including but not limited to the risks of nonoperative treatment, versus surgical intervention including infection, bleeding, nerve injury,  blood clots, cardiopulmonary complications, morbidity, mortality, among others, and they were willing to proceed.   Eduard Roux, MD   04/03/2019 8:05 AM

## 2019-04-03 NOTE — Transfer of Care (Signed)
Immediate Anesthesia Transfer of Care Note  Patient: LOWEN WIRTANEN  Procedure(s) Performed: RIGHT KNEE ARTHROSCOPY WITH PARTIAL MEDIAL MENISCECTOMY (Right Knee)  Patient Location: PACU  Anesthesia Type:General  Level of Consciousness: awake and patient cooperative  Airway & Oxygen Therapy: Patient Spontanous Breathing and Patient connected to face mask oxygen  Post-op Assessment: Report given to RN, Post -op Vital signs reviewed and stable, Patient moving all extremities X 4 and Patient able to stick tongue midline  Post vital signs: Reviewed and stable  Last Vitals:  Vitals Value Taken Time  BP    Temp    Pulse 67 04/03/19 1310  Resp 15 04/03/19 1310  SpO2 100 % 04/03/19 1310  Vitals shown include unvalidated device data.  Last Pain:  Vitals:   04/03/19 1131  TempSrc: Oral  PainSc: 5       Patients Stated Pain Goal: 3 ([id number])  Complications: No apparent anesthesia complications

## 2019-04-06 ENCOUNTER — Encounter: Payer: Self-pay | Admitting: *Deleted

## 2019-04-06 NOTE — Anesthesia Postprocedure Evaluation (Signed)
Anesthesia Post Note  Patient: Mathew Atkinson  Procedure(s) Performed: RIGHT KNEE ARTHROSCOPY WITH PARTIAL MEDIAL MENISCECTOMY (Right Knee)     Patient location during evaluation: PACU Anesthesia Type: General Level of consciousness: awake and alert Pain management: pain level controlled Vital Signs Assessment: post-procedure vital signs reviewed and stable Respiratory status: spontaneous breathing, nonlabored ventilation, respiratory function stable and patient connected to nasal cannula oxygen Cardiovascular status: blood pressure returned to baseline and stable Postop Assessment: no apparent nausea or vomiting Anesthetic complications: no    Last Vitals:  Vitals:   04/03/19 1410 04/03/19 1415  BP:    Pulse: (!) 44 60  Resp:    Temp:    SpO2: 100% 100%    Last Pain:  Vitals:   04/03/19 1405  TempSrc:   PainSc: 0-No pain   Pain Goal: Patients Stated Pain Goal: 3 (04/03/19 1131)                 Barnet Glasgow

## 2019-04-10 ENCOUNTER — Ambulatory Visit (INDEPENDENT_AMBULATORY_CARE_PROVIDER_SITE_OTHER): Payer: BC Managed Care – PPO | Admitting: Physician Assistant

## 2019-04-10 ENCOUNTER — Other Ambulatory Visit: Payer: Self-pay

## 2019-04-10 ENCOUNTER — Encounter: Payer: Self-pay | Admitting: Orthopaedic Surgery

## 2019-04-10 DIAGNOSIS — Z9889 Other specified postprocedural states: Secondary | ICD-10-CM

## 2019-04-10 NOTE — Progress Notes (Signed)
   Post-Op Visit Note   Patient: Mathew Atkinson           Date of Birth: 1970-05-24           MRN: [id number] Visit Date: 04/10/2019 PCP: Laurey Morale, MD   Assessment & Plan:  Chief Complaint:  Chief Complaint  Patient presents with  . Right Knee - Pain, Routine Post Op   Visit Diagnoses:  1. S/P arthroscopy of right knee     Plan: Patient is a pleasant 49 year old gentleman who comes in today 1 week out right knee arthroscopic debridement medial meniscus 04/03/2019.  He has been doing well.  He actually feels better now than he did prior to surgical intervention.  No fevers or chills.  He has been taking over-the-counter medications as needed for pain.  Examination of his right knee reveals fully healed surgical portals with nylon sutures in place.  No evidence of infection.  Calf is soft and nontender.  He is neurovascularly intact distally.  Today, nylon sutures were removed and Steri-Strips applied.  I provided the patient with a home exercise program.  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  Work note provided today.  Follow-up with [place] in 5 weeks time for repeat evaluation.  Follow-Up Instructions: Return in about 5 weeks (around 05/15/2019).   Orders:  No orders of the defined types were placed in this encounter.  No orders of the defined types were placed in this encounter.   Imaging: No new imaging  PMFS History: Patient Active Problem List   Diagnosis Date Noted  . Synovitis of knee   . Acute medial meniscus tear of right knee 03/27/2019  . Chronic pain of right knee 03/06/2019  . Plantar fasciitis, left 12/27/2015   Past Medical History:  Diagnosis Date  . History of COVID-19 01/11/2019    Family History  Problem Relation Age of Onset  . Diabetes Other   . Thyroid disease Other     Past Surgical History:  Procedure Laterality Date  . KNEE ARTHROSCOPY WITH  MEDIAL MENISECTOMY Right 04/03/2019   Procedure: RIGHT KNEE ARTHROSCOPY WITH PARTIAL MEDIAL MENISCECTOMY;  Surgeon: Leandrew Koyanagi, MD;  Location: [place];  Service: Orthopedics;  Laterality: Right;  . NO PAST SURGERIES     Social History   Occupational History  . Not on file  Tobacco Use  . Smoking status: Never Smoker  . Smokeless tobacco: Never Used  Substance and Sexual Activity  . Alcohol use: Yes    Comment: once or twice a month  . Drug use: No  . Sexual activity: Not on file

## 2019-04-13 ENCOUNTER — Telehealth: Payer: Self-pay | Admitting: Family Medicine

## 2019-04-13 NOTE — Telephone Encounter (Signed)
Pt is requesting a call back concerning his FMLA paperwork.

## 2019-04-14 NOTE — Telephone Encounter (Signed)
Left message for patient to call back  

## 2019-04-15 NOTE — Telephone Encounter (Signed)
Per Dr. Sarajane Jews, the patients disability paper work needs to be filled out by Dr. Lavone Neri, his orthopedist.  Left message for patient to call back.

## 2019-04-16 ENCOUNTER — Telehealth: Payer: Self-pay | Admitting: Orthopaedic Surgery

## 2019-04-16 NOTE — Telephone Encounter (Signed)
Patient called stating he is having problems with [employer] and his disability claim as they sent their fax to Dr. Barbie Banner office. I explained to patient that he needs to contact [place] and have them to fax our office. He stated he has our fax number and will contact them right away.

## 2019-04-16 NOTE — Telephone Encounter (Signed)
Pt called about completion of FMLA paperwork. I advised pt of previous message, he said he would contact Dr. Erlinda Hong office.

## 2019-04-16 NOTE — Telephone Encounter (Signed)
Noted. Nothing further needed. 

## 2019-04-20 NOTE — Telephone Encounter (Signed)
eror

## 2019-04-27 ENCOUNTER — Other Ambulatory Visit: Payer: Self-pay | Admitting: Family Medicine

## 2019-05-15 ENCOUNTER — Ambulatory Visit (INDEPENDENT_AMBULATORY_CARE_PROVIDER_SITE_OTHER): Payer: BC Managed Care – PPO | Admitting: Orthopaedic Surgery

## 2019-05-15 ENCOUNTER — Encounter: Payer: Self-pay | Admitting: Orthopaedic Surgery

## 2019-05-15 ENCOUNTER — Other Ambulatory Visit: Payer: Self-pay

## 2019-05-15 VITALS — Ht 71.0 in | Wt 296.0 lb

## 2019-05-15 DIAGNOSIS — Z9889 Other specified postprocedural states: Secondary | ICD-10-CM

## 2019-05-15 NOTE — Progress Notes (Signed)
   Post-Op Visit Note   Patient: Mathew Atkinson           Date of Birth: July 30, 1970           MRN: [id number] Visit Date: 05/15/2019 PCP: Laurey Morale, MD   Assessment & Plan:  Chief Complaint:  Chief Complaint  Patient presents with  . Right Knee - Follow-up    Right knee scope DOS 04/03/2019   Visit Diagnoses:  1. S/P arthroscopy of right knee     Plan: Ami is 6 weeks status post right knee scope with partial medial meniscectomy.  He reports no pain preoperative mechanical symptoms.  He was walking this past Tuesday and felt his knee buckle due to weakness.  He feels weak with stairs.  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  There is no light duty available.  On physical exam his surgical scars are fully healed.  He has regained his baseline range of motion.  He does have weakness in quadriceps with testing.  No joint line tenderness.  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  Recheck in 3 weeks.  Follow-Up Instructions: Return in about 3 weeks (around 06/05/2019).   Orders:  No orders of the defined types were placed in this encounter.  No orders of the defined types were placed in this encounter.   Imaging: No results found.  PMFS History: Patient Active Problem List   Diagnosis Date Noted  . Synovitis of knee   . Acute medial meniscus tear of right knee 03/27/2019  . Chronic pain of right knee 03/06/2019  . Plantar fasciitis, left 12/27/2015   Past Medical History:  Diagnosis Date  . History of COVID-19 01/11/2019    Family History  Problem Relation Age of Onset  . Diabetes Other   . Thyroid disease Other     Past Surgical History:  Procedure Laterality Date  . KNEE ARTHROSCOPY WITH MEDIAL MENISECTOMY Right 04/03/2019   Procedure: RIGHT KNEE ARTHROSCOPY WITH PARTIAL MEDIAL MENISCECTOMY;  Surgeon: Leandrew Koyanagi, MD;  Location: [place];  Service: Orthopedics;  Laterality: Right;  . NO PAST SURGERIES     Social History   Occupational History  . Not on file  Tobacco Use  . Smoking status: Never Smoker  . Smokeless tobacco: Never Used  Substance and Sexual Activity  . Alcohol use: Yes    Comment: once or twice a month  . Drug use: No  . Sexual activity: Not on file

## 2019-06-01 ENCOUNTER — Telehealth: Payer: Self-pay | Admitting: Orthopaedic Surgery

## 2019-06-01 NOTE — Telephone Encounter (Signed)
Received vm from patient wanting status of his forms. IC,lmvm. I advised, per [employer] they mailed him letter advising need to pay $25.00 form fee. I advised he can drop off payment here or go to [employer], I left their address and phone number.

## 2019-06-09 ENCOUNTER — Telehealth: Payer: Self-pay | Admitting: Orthopaedic Surgery

## 2019-06-09 NOTE — Telephone Encounter (Signed)
Received vm fom pt checking on forms. IC,lmvm advised [employer] faxed completed forms back to [facility] today.

## 2019-06-11 ENCOUNTER — Telehealth: Payer: Self-pay | Admitting: Orthopaedic Surgery

## 2019-06-11 NOTE — Telephone Encounter (Signed)
refaxed [employer] forms [phone], advised patient to contact me if need anything else

## 2019-06-29 ENCOUNTER — Other Ambulatory Visit: Payer: Self-pay | Admitting: Family Medicine

## 2019-11-20 IMAGING — CT CT OF THE LEFT FEMUR WITH CONTRAST
3 of 4 series · 12 of 35 positions shown, 14 images · IV contrast (agent unspecified)
Comparison: None.

CONTRAST:  100 mL YVJPLY-ORR IOPAMIDOL (YVJPLY-ORR) INJECTION 61%

CLINICAL DATA: Shooting pain in the left upper leg for 2 months.
Question mass lesion impinging on a nerve.

EXAM:
CT OF THE LOWER LEFT EXTREMITY WITH CONTRAST
TECHNIQUE: Multidetector CT imaging of the lower left extremity was performed
from above the left hip through the knee according to the standard
protocol following intravenous contrast administration.

[Series 9: lfov lower extremity 2.00 br40 s3 cor soft · coronal · 0.69mm/px · 3 of 167 slices shown]
[im 34/167  bone]
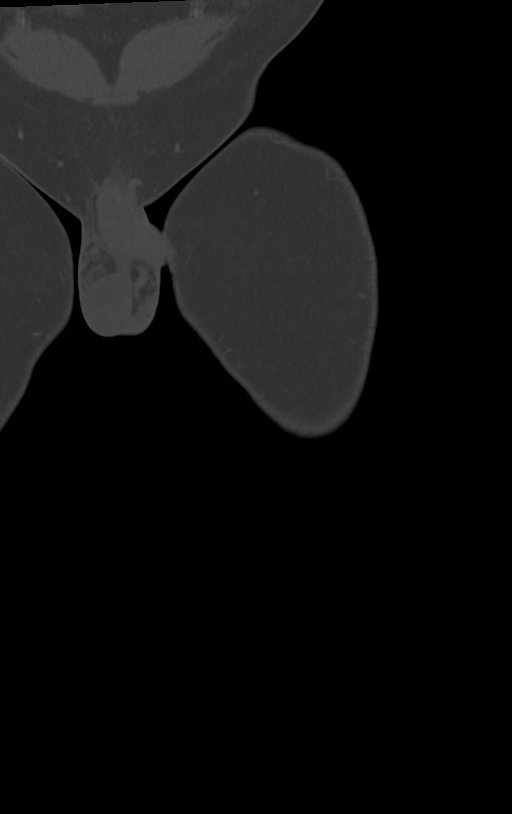
[im 67/167  bone]
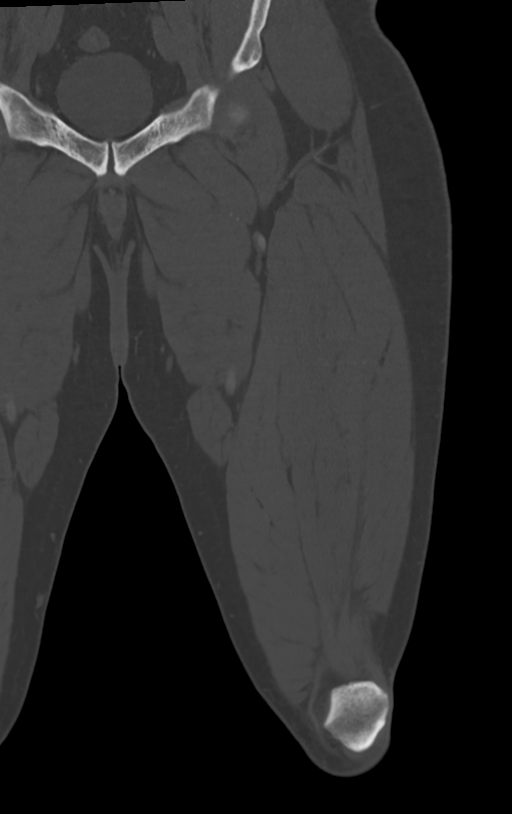
[im 100/167  bone]
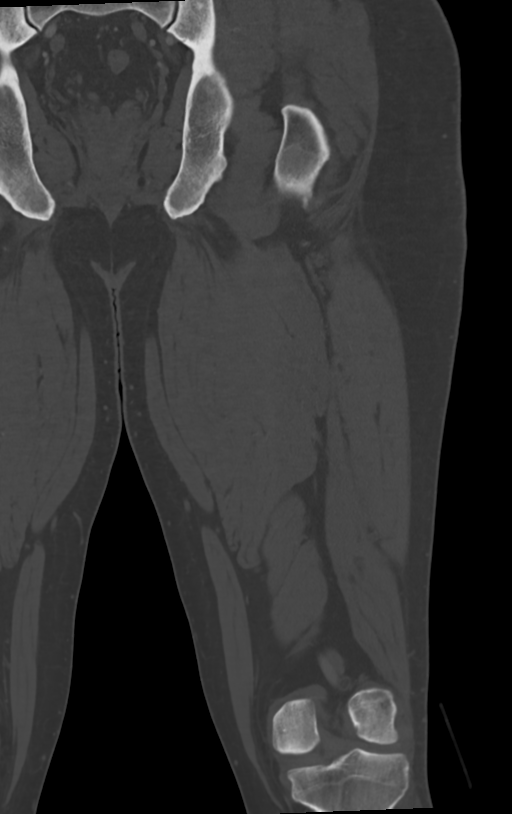

[Series 13: lfov lower extremity 2.00 br40 s3 sag soft · sagittal · 0.66mm/px · 5 of 172 slices shown, 6 images]
[im 58/172  bone]
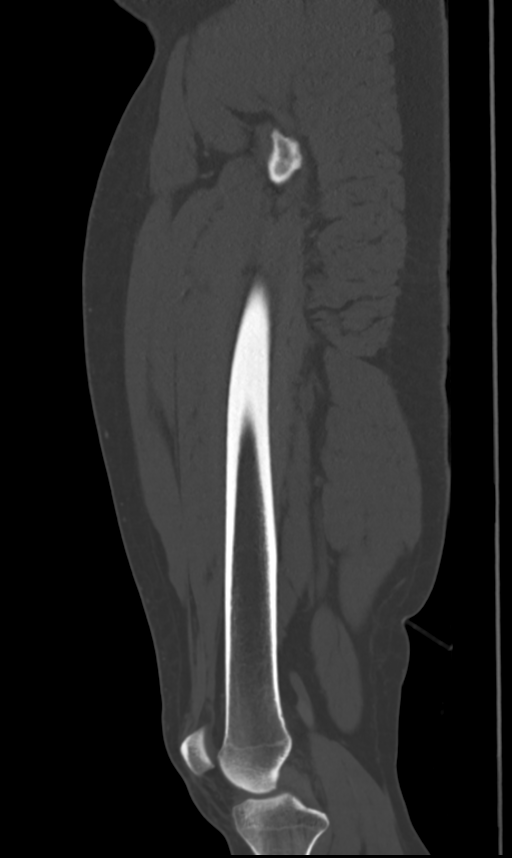
[im 72/172  bone]
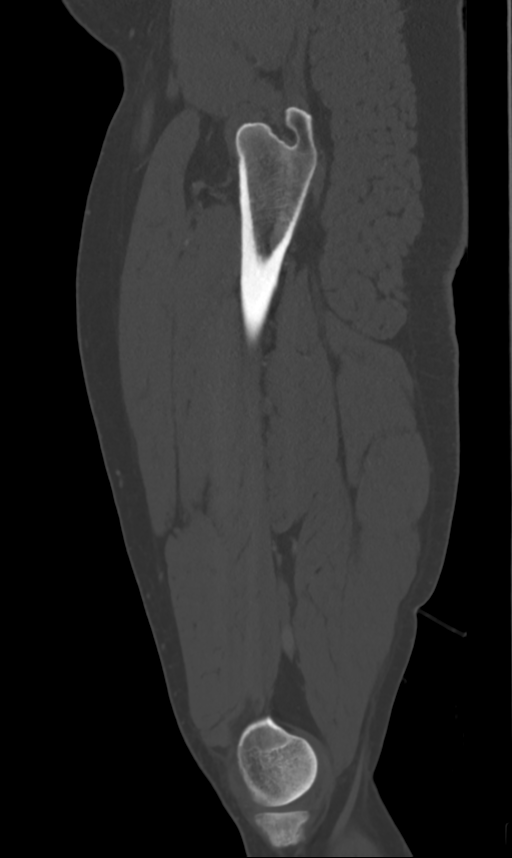
[im 86/172  soft-tissue]
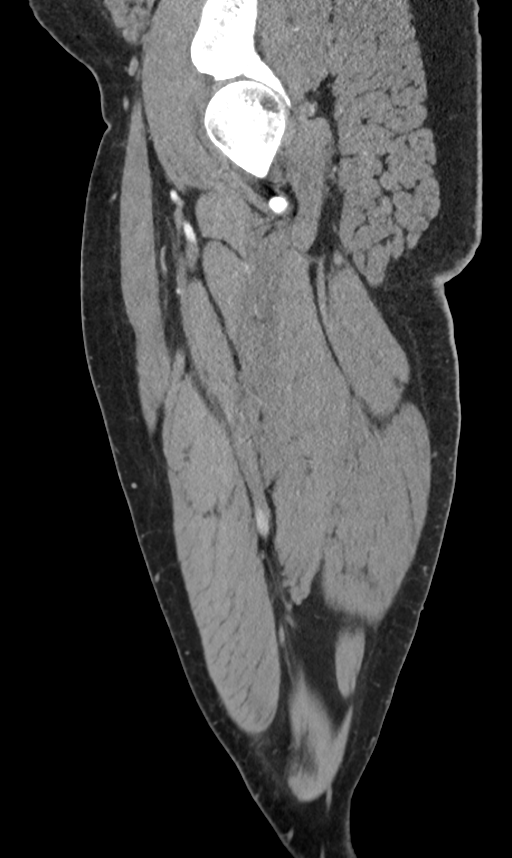
[im 86/172  bone]
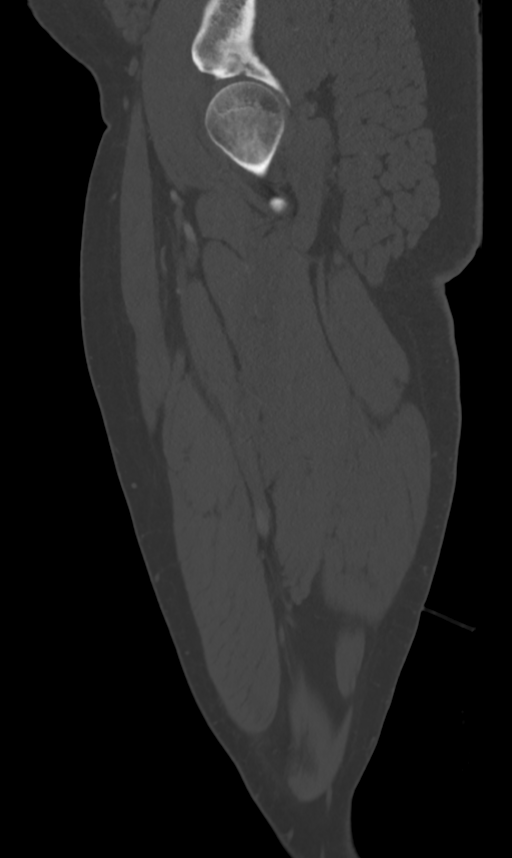
[im 100/172  bone]
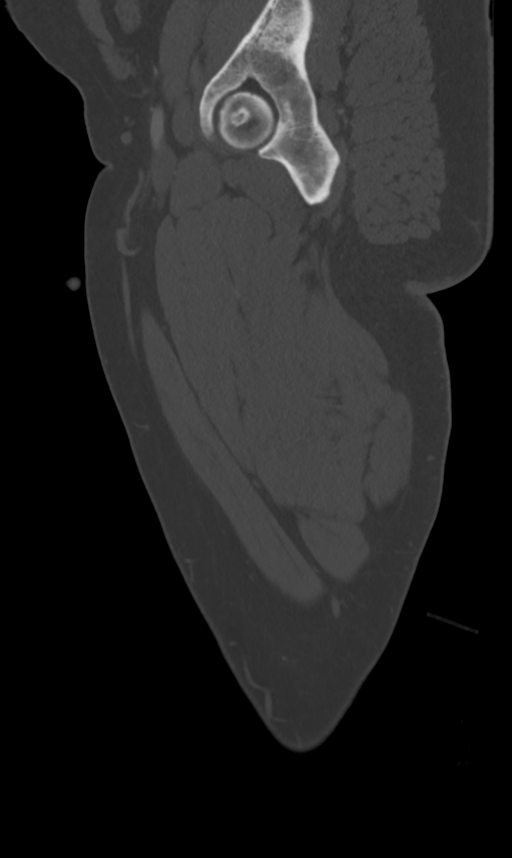
[im 115/172  bone]
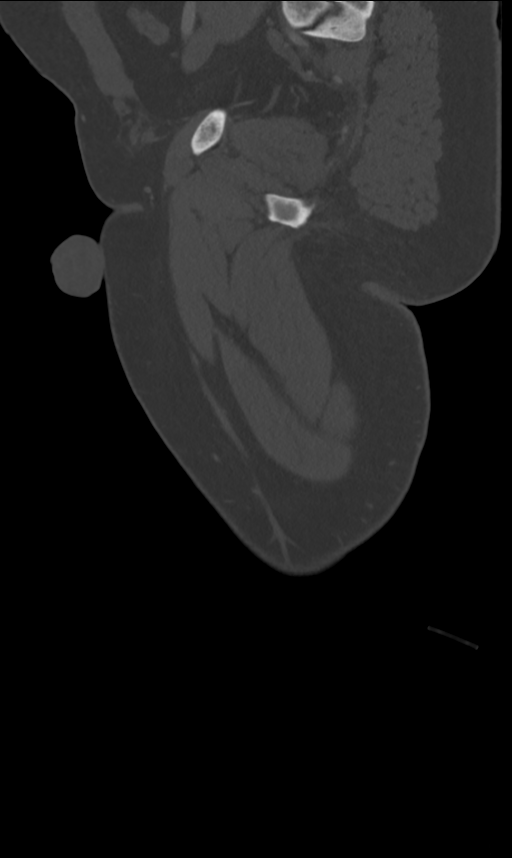

[Series 16: lfov lower extremity 0.60 br40 s3 soft thin · axial · 0.71mm/px · z∈[+836,+1173]mm · 4 of 936 slices shown, 5 images]
[im 188/936  soft-tissue]
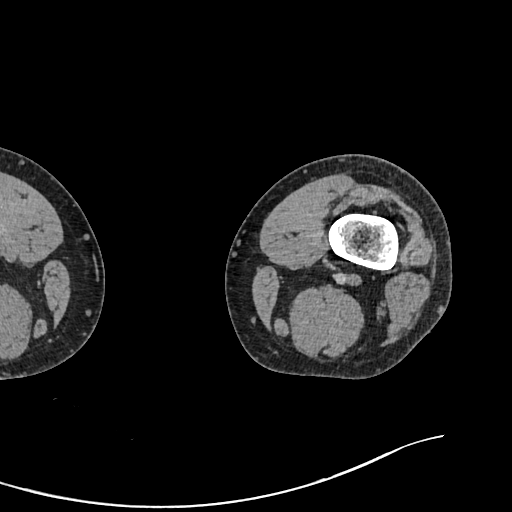
[im 188/936  bone]
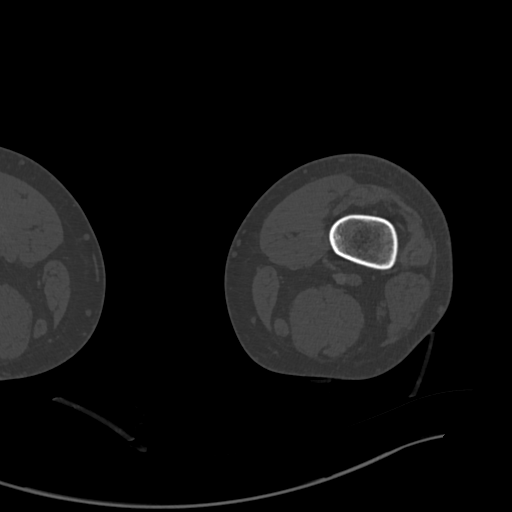
[im 375/936  bone]
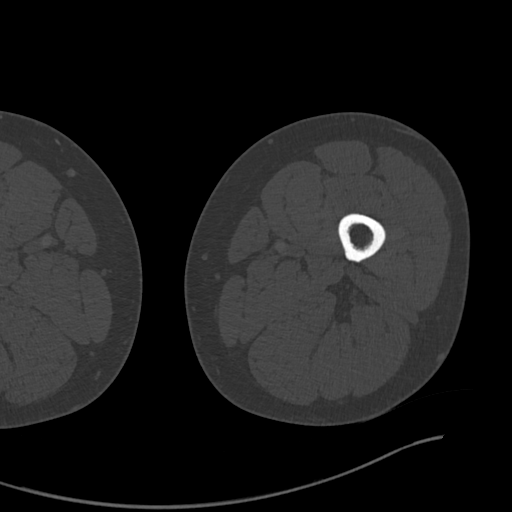
[im 562/936  bone]
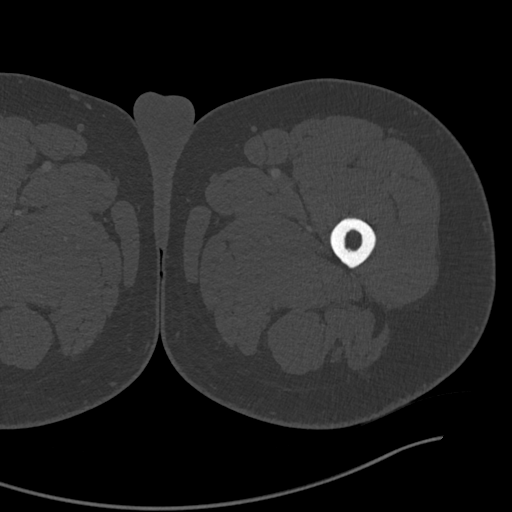
[im 749/936  bone]
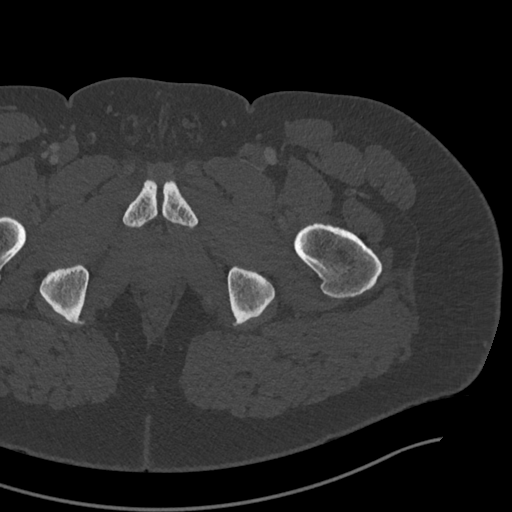

[12 of 35 positions shown; findings below may reference images not displayed]

FINDINGS: Bones/Joint/Cartilage

No acute or focal bony or joint abnormality is seen. No avascular
necrosis of the femoral head. There is mild medial compartment joint
space narrowing and subchondral sclerosis in the left knee. The left
hip is unremarkable. Limited visualization of the pelvis is normal.

Ligaments

Suboptimally assessed by CT.

Muscles and Tendons

Intact and normal in appearance.

Soft tissues

No lipoma or other mass is identified. No nerve impingement is seen.
Major neurovascular bundles appear normal.
IMPRESSION: Negative for mass.  No nerve impingement is identified.

Mild medial compartment osteoarthritis.

## 2019-12-31 ENCOUNTER — Ambulatory Visit: Payer: BC Managed Care – PPO

## 2020-01-01 ENCOUNTER — Ambulatory Visit: Payer: Self-pay | Attending: Internal Medicine

## 2020-01-01 DIAGNOSIS — Z23 Encounter for immunization: Secondary | ICD-10-CM

## 2020-01-01 NOTE — Progress Notes (Signed)
   Covid-19 Vaccination Clinic  Name:  Mathew Atkinson    MRN: [id number] DOB: 1970/11/01  01/01/2020  Mathew Atkinson was observed post Covid-19 immunization for 15 minutes without incident. He was provided with Vaccine Information Sheet and instruction to access the V-Safe system.   Mathew Atkinson was instructed to call 911 with any severe reactions post vaccine: Marland Kitchen Difficulty breathing  . Swelling of face and throat  . A fast heartbeat  . A bad rash all over body  . Dizziness and weakness   Immunizations Administered    Name Date Dose VIS Date Route   Pfizer COVID-19 Vaccine 01/01/2020  2:46 PM 0.3 mL 11/04/2019 Intramuscular   Manufacturer: [place]   Lot: [id number]   [place]: [id number]

## 2021-04-26 ENCOUNTER — Ambulatory Visit (INDEPENDENT_AMBULATORY_CARE_PROVIDER_SITE_OTHER): Payer: BC Managed Care – PPO | Admitting: Family Medicine

## 2021-04-26 ENCOUNTER — Encounter: Payer: Self-pay | Admitting: Family Medicine

## 2021-04-26 VITALS — BP 118/80 | HR 58 | Temp 98.5°F | Wt 299.0 lb

## 2021-04-26 DIAGNOSIS — E039 Hypothyroidism, unspecified: Secondary | ICD-10-CM | POA: Diagnosis not present

## 2021-04-26 LAB — TSH: TSH: 1.58 u[IU]/mL (ref 0.35–5.50)

## 2021-04-26 LAB — T3, FREE: T3, Free: 3.1 pg/mL (ref 2.3–4.2)

## 2021-04-26 LAB — T4, FREE: Free T4: 0.74 ng/dL (ref 0.60–1.60)

## 2021-04-26 NOTE — Progress Notes (Signed)
? ?  Subjective:  ? ? Patient ID: Mathew Atkinson, male    DOB: 10-12-1970, 51 y.o.   MRN: [id number] ? ?HPI ?Here to check his thyroid status. He feels fine. He is overweight, but his weight has been stable. He recently found out his 9 year old father has been taking a thyroid medication for years, and he wonders if he might have the same problem. ? ? ?Review of Systems  ?Constitutional: Negative.   ?Respiratory: Negative.    ?Cardiovascular: Negative.   ?Endocrine: Negative.   ? ?   ?Objective:  ? Physical Exam ?Constitutional:   ?   Appearance: He is obese.  ?Cardiovascular:  ?   Rate and Rhythm: Normal rate and regular rhythm.  ?   Pulses: Normal pulses.  ?   Heart sounds: Normal heart sounds.  ?Pulmonary:  ?   Effort: Pulmonary effort is normal.  ?   Breath sounds: Normal breath sounds.  ?Neurological:  ?   Mental Status: He is alert.  ? ? ? ? ? ?   ?Assessment & Plan:  ?Family hx of a thyroid disorder. We will check a TSH, free T3, and freeT4 today. I also asked him to schedule a well exam with fasting labs sometime soon.  ?Alysia Penna, MD ? ? ?

## 2021-07-04 ENCOUNTER — Encounter: Payer: Self-pay | Admitting: Family Medicine

## 2021-07-10 ENCOUNTER — Encounter: Payer: BC Managed Care – PPO | Admitting: Family Medicine

## 2021-07-28 ENCOUNTER — Ambulatory Visit (INDEPENDENT_AMBULATORY_CARE_PROVIDER_SITE_OTHER): Payer: BC Managed Care – PPO | Admitting: Family Medicine

## 2021-07-28 ENCOUNTER — Encounter: Payer: Self-pay | Admitting: Family Medicine

## 2021-07-28 VITALS — BP 118/78 | HR 58 | Temp 98.6°F | Ht 71.0 in | Wt 300.0 lb

## 2021-07-28 DIAGNOSIS — Z0000 Encounter for general adult medical examination without abnormal findings: Secondary | ICD-10-CM | POA: Diagnosis not present

## 2021-07-28 LAB — CBC WITH DIFFERENTIAL/PLATELET
Basophils Absolute: 0.1 10*3/uL (ref 0.0–0.1)
Basophils Relative: 1 % (ref 0.0–3.0)
Eosinophils Absolute: 0.1 10*3/uL (ref 0.0–0.7)
Eosinophils Relative: 2.5 % (ref 0.0–5.0)
HCT: 47.5 % (ref 39.0–52.0)
Hemoglobin: 15.7 g/dL (ref 13.0–17.0)
Lymphocytes Relative: 33 % (ref 12.0–46.0)
Lymphs Abs: 1.8 10*3/uL (ref 0.7–4.0)
MCHC: 33.1 g/dL (ref 30.0–36.0)
MCV: 88.2 fl (ref 78.0–100.0)
Monocytes Absolute: 0.6 10*3/uL (ref 0.1–1.0)
Monocytes Relative: 10.7 % (ref 3.0–12.0)
Neutro Abs: 2.9 10*3/uL (ref 1.4–7.7)
Neutrophils Relative %: 52.8 % (ref 43.0–77.0)
Platelets: 266 10*3/uL (ref 150.0–400.0)
RBC: 5.39 Mil/uL (ref 4.22–5.81)
RDW: 14.2 % (ref 11.5–15.5)
WBC: 5.4 10*3/uL (ref 4.0–10.5)

## 2021-07-28 LAB — BASIC METABOLIC PANEL
BUN: 18 mg/dL (ref 6–23)
CO2: 25 mEq/L (ref 19–32)
Calcium: 9.4 mg/dL (ref 8.4–10.5)
Chloride: 105 mEq/L (ref 96–112)
Creatinine, Ser: 1.35 mg/dL (ref 0.40–1.50)
GFR: 60.67 mL/min (ref >60.00)
Glucose, Bld: 92 mg/dL (ref 70–99)
Potassium: 4.2 mEq/L (ref 3.5–5.1)
Sodium: 139 mEq/L (ref 135–145)

## 2021-07-28 LAB — HEPATIC FUNCTION PANEL
ALT: 31 U/L (ref 0–53)
AST: 21 U/L (ref 0–37)
Albumin: 4.3 g/dL (ref 3.5–5.2)
Alkaline Phosphatase: 64 U/L (ref 39–117)
Bilirubin, Direct: 0.1 mg/dL (ref 0.0–0.3)
Total Bilirubin: 0.6 mg/dL (ref 0.2–1.2)
Total Protein: 7.1 g/dL (ref 6.0–8.3)

## 2021-07-28 LAB — LIPID PANEL
Cholesterol: 185 mg/dL (ref 0–200)
HDL: 49.2 mg/dL (ref >39.00)
LDL Cholesterol: 117 mg/dL — ABNORMAL HIGH (ref 0–99)
NonHDL: 135.99
Total CHOL/HDL Ratio: 4
Triglycerides: 96 mg/dL (ref 0.0–149.0)
VLDL: 19.2 mg/dL (ref 0.0–40.0)

## 2021-07-28 LAB — HEMOGLOBIN A1C: Hgb A1c MFr Bld: 5.8 % (ref 4.6–6.5)

## 2021-07-28 LAB — PSA: PSA: 0.65 ng/mL (ref 0.10–4.00)

## 2021-07-28 LAB — TSH: TSH: 0.49 u[IU]/mL (ref 0.35–5.50)

## 2021-07-28 NOTE — Progress Notes (Signed)
   Subjective:    Patient ID: Mathew Atkinson, male    DOB: 08-17-1970, 51 y.o.   MRN: [id number]  HPI Here for a well exam. He feels fine.    Review of Systems  Constitutional: Negative.   HENT: Negative.    Eyes: Negative.   Respiratory: Negative.    Cardiovascular: Negative.   Gastrointestinal: Negative.   Genitourinary: Negative.   Musculoskeletal: Negative.   Skin: Negative.   Neurological: Negative.   Psychiatric/Behavioral: Negative.         Objective:   Physical Exam Constitutional:      General: He is not in acute distress.    Appearance: He is well-developed. He is obese. He is not diaphoretic.  HENT:     Head: Normocephalic and atraumatic.     Right Ear: External ear normal.     Left Ear: External ear normal.     Nose: Nose normal.     Mouth/Throat:     Pharynx: No oropharyngeal exudate.  Eyes:     General: No scleral icterus.       Right eye: No discharge.        Left eye: No discharge.     Conjunctiva/sclera: Conjunctivae normal.     Pupils: Pupils are equal, round, and reactive to light.  Neck:     Thyroid: No thyromegaly.     Vascular: No JVD.     Trachea: No tracheal deviation.  Cardiovascular:     Rate and Rhythm: Normal rate and regular rhythm.     Heart sounds: Normal heart sounds. No murmur heard.    No friction rub. No gallop.  Pulmonary:     Effort: Pulmonary effort is normal. No respiratory distress.     Breath sounds: Normal breath sounds. No wheezing or rales.  Chest:     Chest wall: No tenderness.  Abdominal:     General: Bowel sounds are normal. There is no distension.     Palpations: Abdomen is soft. There is no mass.     Tenderness: There is no abdominal tenderness. There is no guarding or rebound.  Genitourinary:    Penis: Normal. No tenderness.      Testes: Normal.     Prostate: Normal.     Rectum: Normal. Guaiac result negative.  Musculoskeletal:        General: No tenderness. Normal range of motion.     Cervical back: Neck  supple.  Lymphadenopathy:     Cervical: No cervical adenopathy.  Skin:    General: Skin is warm and dry.     Coloration: Skin is not pale.     Findings: No erythema or rash.  Neurological:     Mental Status: He is alert and oriented to person, place, and time.     Cranial Nerves: No cranial nerve deficit.     Motor: No abnormal muscle tone.     Coordination: Coordination normal.     Deep Tendon Reflexes: Reflexes are normal and symmetric. Reflexes normal.  Psychiatric:        Behavior: Behavior normal.        Thought Content: Thought content normal.        Judgment: Judgment normal.           Assessment & Plan:  Well exam. We discussed diet and exercise. Get fasting labs. Refer for his first colonoscopy.  Alysia Penna, MD

## 2022-12-11 ENCOUNTER — Encounter: Payer: Self-pay | Admitting: Family Medicine

## 2022-12-11 ENCOUNTER — Ambulatory Visit (INDEPENDENT_AMBULATORY_CARE_PROVIDER_SITE_OTHER): Payer: 59 | Admitting: Family Medicine

## 2022-12-11 VITALS — BP 120/78 | HR 63 | Temp 98.3°F | Wt 324.0 lb

## 2022-12-11 DIAGNOSIS — E785 Hyperlipidemia, unspecified: Secondary | ICD-10-CM

## 2022-12-11 DIAGNOSIS — R635 Abnormal weight gain: Secondary | ICD-10-CM | POA: Diagnosis not present

## 2022-12-11 DIAGNOSIS — R739 Hyperglycemia, unspecified: Secondary | ICD-10-CM

## 2022-12-11 DIAGNOSIS — R600 Localized edema: Secondary | ICD-10-CM | POA: Diagnosis not present

## 2022-12-11 DIAGNOSIS — E039 Hypothyroidism, unspecified: Secondary | ICD-10-CM | POA: Diagnosis not present

## 2022-12-11 LAB — CBC WITH DIFFERENTIAL/PLATELET
Basophils Absolute: 0 10*3/uL (ref 0.0–0.1)
Basophils Relative: 0.8 % (ref 0.0–3.0)
Eosinophils Absolute: 0.1 10*3/uL (ref 0.0–0.7)
Eosinophils Relative: 2 % (ref 0.0–5.0)
HCT: 47.5 % (ref 39.0–52.0)
Hemoglobin: 15.7 g/dL (ref 13.0–17.0)
Lymphocytes Relative: 34.1 % (ref 12.0–46.0)
Lymphs Abs: 1.8 10*3/uL (ref 0.7–4.0)
MCHC: 33.1 g/dL (ref 30.0–36.0)
MCV: 90.4 fL (ref 78.0–100.0)
Monocytes Absolute: 0.5 10*3/uL (ref 0.1–1.0)
Monocytes Relative: 8.7 % (ref 3.0–12.0)
Neutro Abs: 2.9 10*3/uL (ref 1.4–7.7)
Neutrophils Relative %: 54.4 % (ref 43.0–77.0)
Platelets: 269 10*3/uL (ref 150.0–400.0)
RBC: 5.25 Mil/uL (ref 4.22–5.81)
RDW: 14.1 % (ref 11.5–15.5)
WBC: 5.3 10*3/uL (ref 4.0–10.5)

## 2022-12-11 LAB — BASIC METABOLIC PANEL
BUN: 19 mg/dL (ref 6–23)
CO2: 27 meq/L (ref 19–32)
Calcium: 9.5 mg/dL (ref 8.4–10.5)
Chloride: 108 meq/L (ref 96–112)
Creatinine, Ser: 1.34 mg/dL (ref 0.40–1.50)
GFR: 60.63 mL/min (ref >60.00)
Glucose, Bld: 102 mg/dL — ABNORMAL HIGH (ref 70–99)
Potassium: 4.4 meq/L (ref 3.5–5.1)
Sodium: 142 meq/L (ref 135–145)

## 2022-12-11 LAB — LIPID PANEL
Cholesterol: 216 mg/dL — ABNORMAL HIGH (ref 0–200)
HDL: 48.1 mg/dL (ref >39.00)
LDL Cholesterol: 153 mg/dL — ABNORMAL HIGH (ref 0–99)
NonHDL: 167.51
Total CHOL/HDL Ratio: 4
Triglycerides: 73 mg/dL (ref 0.0–149.0)
VLDL: 14.6 mg/dL (ref 0.0–40.0)

## 2022-12-11 LAB — HEMOGLOBIN A1C: Hgb A1c MFr Bld: 5.9 % (ref 4.6–6.5)

## 2022-12-11 LAB — HEPATIC FUNCTION PANEL
ALT: 29 U/L (ref 0–53)
AST: 22 U/L (ref 0–37)
Albumin: 4.2 g/dL (ref 3.5–5.2)
Alkaline Phosphatase: 64 U/L (ref 39–117)
Bilirubin, Direct: 0.1 mg/dL (ref 0.0–0.3)
Total Bilirubin: 0.4 mg/dL (ref 0.2–1.2)
Total Protein: 7.1 g/dL (ref 6.0–8.3)

## 2022-12-11 LAB — T4, FREE: Free T4: 0.77 ng/dL (ref 0.60–1.60)

## 2022-12-11 LAB — TSH: TSH: 0.86 u[IU]/mL (ref 0.35–5.50)

## 2022-12-11 LAB — T3, FREE: T3, Free: 3.7 pg/mL (ref 2.3–4.2)

## 2022-12-11 LAB — BRAIN NATRIURETIC PEPTIDE: Pro B Natriuretic peptide (BNP): 6 pg/mL (ref 0.0–100.0)

## 2022-12-11 LAB — PSA: PSA: 1.52 ng/mL (ref 0.10–4.00)

## 2022-12-11 MED ORDER — FUROSEMIDE 20 MG PO TABS: 20.0000 mg | ORAL_TABLET | Freq: Every day | ORAL | 0 refills | Status: AC

## 2022-12-11 NOTE — Progress Notes (Signed)
   Subjective:    Patient ID: Mathew Atkinson, male    DOB: 04-09-1970, 52 y.o.   MRN: [id number]  HPI Here for weight gain and swelling in both legs over the past 4 weeks. No chest pain or SOB. He takes no medication except for an occasional Aleve. He has gained 24 lbs in the past 2 months. He notes that his father was diagnosed with hypothyroidism as an adult.    Review of Systems  Constitutional: Negative.   Respiratory: Negative.    Cardiovascular:  Positive for leg swelling. Negative for chest pain and palpitations.  Gastrointestinal: Negative.   Genitourinary: Negative.        Objective:   Physical Exam Constitutional:      Appearance: He is obese. He is not ill-appearing.  Cardiovascular:     Rate and Rhythm: Normal rate and regular rhythm.     Pulses: Normal pulses.     Heart sounds: Normal heart sounds.  Pulmonary:     Effort: Pulmonary effort is normal.     Breath sounds: Normal breath sounds.  Musculoskeletal:     Comments: 3+ edema in both lower legs to the knees   Neurological:     Mental Status: He is alert.           Assessment & Plan:  Sudden onset of leg swelling and weight gain. I think hypothyroidism is a likely etiology. He can take Lasix 20 mg daily as needed. We will get labs today to include a BMET, a thyroid panel, and a BNP. Set up an ECHO soon.  Gershon Crane, MD

## 2022-12-12 NOTE — Addendum Note (Signed)
Addended by: Johnella Moloney on: 12/12/2022 04:44 PM   Modules accepted: Orders

## 2023-01-17 ENCOUNTER — Ambulatory Visit (HOSPITAL_COMMUNITY): Payer: 59 | Attending: Family Medicine

## 2023-01-17 DIAGNOSIS — R600 Localized edema: Secondary | ICD-10-CM | POA: Diagnosis present

## 2023-01-17 LAB — ECHOCARDIOGRAM COMPLETE: S' Lateral: 3.13 cm

## 2023-01-23 ENCOUNTER — Telehealth: Payer: Self-pay

## 2023-01-23 NOTE — Telephone Encounter (Signed)
 Copied from CRM [id number]. Topic: General - Other >> Jan 23, 2023  2:55 PM Sonny Dandy B wrote: Reason for CRM: pt returning a call from a Joann, Pt is requesting a call back . [phone]

## 2023-01-24 NOTE — Telephone Encounter (Signed)
 Pt already notified of her lab results

## 2023-03-15 ENCOUNTER — Other Ambulatory Visit: Payer: 59

## 2023-03-15 DIAGNOSIS — E785 Hyperlipidemia, unspecified: Secondary | ICD-10-CM

## 2023-03-15 LAB — LIPID PANEL
Cholesterol: 185 mg/dL (ref 0–200)
HDL: 43.7 mg/dL (ref >39.00)
LDL Cholesterol: 121 mg/dL — ABNORMAL HIGH (ref 0–99)
NonHDL: 141.5
Total CHOL/HDL Ratio: 4
Triglycerides: 104 mg/dL (ref 0.0–149.0)
VLDL: 20.8 mg/dL (ref 0.0–40.0)

## 2023-03-18 ENCOUNTER — Encounter: Payer: Self-pay | Admitting: *Deleted

## 2024-02-03 ENCOUNTER — Ambulatory Visit: Payer: Self-pay

## 2024-02-03 NOTE — Telephone Encounter (Signed)
 Bilat leg swelling, worse in L leg, pt's wife reports he has been seen for this in the past with no explanations or treatment plans.  FYI Only or Action Required?: FYI only for provider: appointment scheduled on 1/21.  Patient was last seen in primary care on 12/11/2022 by Mathew Atkinson LABOR, MD.  Called Nurse Triage reporting Leg Swelling.  Symptoms began over a year ago.  Interventions attempted: Nothing.  Symptoms are: unchanged.  Triage Disposition: See Physician Within 24 Hours  Patient/caregiver understands and will follow disposition?: Yes    Reason for Triage: Patient is having swelling in his legs, left is much worse. 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 Swelling has been ongoing for awhile, but patient is getting more concerned as it is not going away and is more noticeable than before.   Patient is unhappy with current PCP and would like to transfer to [facility]  Reason for Disposition  [1] MODERATE leg swelling (e.g., swelling extends up to knees) AND [2] new-onset or getting worse  Answer Assessment - Initial Assessment Questions 1. ONSET: When did the swelling start? (e.g., minutes, hours, days)     Approx 1 year  2. LOCATION: What part of the leg is swollen?  Are both legs swollen or just one leg?     Below his knee   3. SEVERITY: How bad is the swelling? (e.g., localized; mild, moderate, severe)     Skin looks really really tight, like it's about to burst. When you mash it in, it leaves an indention. I could walk away and come back and it would still be like that, per wife  4. REDNESS: Is there redness or signs of infection?     Wife of pt unsure  5. PAIN: Is the swelling painful to touch? If Yes, ask: How painful is it?   (Scale 1-10; mild, moderate or severe)     Sometimes but not at this time per wife  6. FEVER: Do you have a fever? If Yes, ask: What is it, how was it measured, and when did  it start?      Denies  7. CAUSE: What do you think is causing the leg swelling?     Wife of pt is unsure   8. MEDICAL HISTORY: Do you have a history of blood clots (e.g., DVT), cancer, heart failure, kidney disease, or liver failure?     Denies  9. RECURRENT SYMPTOM: Have you had leg swelling before? If Yes, ask: When was the last time? What happened that time?     Yes, he has seen PCP for this but has not   10. OTHER SYMPTOMS: Do you have any other symptoms? (e.g., chest pain, difficulty breathing)       Denies  Protocols used: Leg Swelling and Edema-A-AH

## 2024-02-03 NOTE — Telephone Encounter (Signed)
 FYI Left pt a voice message advised of a sooner appointment available on 02/04/24, Advised pt to the office back

## 2024-02-03 NOTE — Telephone Encounter (Signed)
 First of all, if he wants to transfer to a new PCP, that is fine with me. However I should note that he saw us  ONCE over a year ago for this when we examined him and did a work up including lab work, and we sent him for an ECHO. Though no specific cause was found for the swelling, we started him on Lasix  to try to control this. He never followed up with us . I would be happy to see him again if he wishes

## 2024-02-04 NOTE — Telephone Encounter (Signed)
 Left detailed message for pt regarding Dr Johnny response, pt has appointment on tomorrow and I will review the message at the visit

## 2024-02-05 ENCOUNTER — Ambulatory Visit (INDEPENDENT_AMBULATORY_CARE_PROVIDER_SITE_OTHER): Admitting: Family Medicine

## 2024-02-05 VITALS — BP 124/78 | HR 64 | Temp 98.1°F | Wt 333.0 lb

## 2024-02-05 DIAGNOSIS — E785 Hyperlipidemia, unspecified: Secondary | ICD-10-CM

## 2024-02-05 DIAGNOSIS — N138 Other obstructive and reflux uropathy: Secondary | ICD-10-CM | POA: Diagnosis not present

## 2024-02-05 DIAGNOSIS — R739 Hyperglycemia, unspecified: Secondary | ICD-10-CM | POA: Diagnosis not present

## 2024-02-05 DIAGNOSIS — N401 Enlarged prostate with lower urinary tract symptoms: Secondary | ICD-10-CM | POA: Diagnosis not present

## 2024-02-05 DIAGNOSIS — R600 Localized edema: Secondary | ICD-10-CM | POA: Diagnosis not present

## 2024-02-05 NOTE — Progress Notes (Signed)
" ° °  Subjective:    Patient ID: Mathew Atkinson, male    DOB: May 02, 1970, 54 y.o.   MRN: [id number]  HPI Here to follow up on swelling in his legs. We worked this up a year ago, and we got extensive labs including a full thyroid  panel on 12-09-22. These were all normal. We also got an ECHO on 01-17-23 that was completely normal. We gave him some Lasix  to try, but heonly took this for a few weeks. In the last year he has started working out a the gym several days a week, and he has made major changes to his diet including consuming less carbs and less sodium. His BP has been stable. He denies any SOB. In fact he can walk 1.5 miles on a treadmill and have no SOB at all.  Review of Systems  Constitutional: Negative.   Respiratory: Negative.    Cardiovascular:  Positive for leg swelling. Negative for chest pain and palpitations.  Gastrointestinal: Negative.   Genitourinary: Negative.        Objective:   Physical Exam Constitutional:      Appearance: He is obese. He is not ill-appearing.  Cardiovascular:     Rate and Rhythm: Normal rate and regular rhythm.     Pulses: Normal pulses.     Heart sounds: Normal heart sounds.  Pulmonary:     Effort: Pulmonary effort is normal.     Breath sounds: Normal breath sounds.  Musculoskeletal:     Comments: 2+ edema in both lower legs   Neurological:     Mental Status: He is alert.           Assessment & Plan:  Leg edema. We will repeat labs today to check thyroid  function, renal function, etc. He can wear compression stockings during the day.  Garnette Olmsted, MD   "

## 2024-02-06 ENCOUNTER — Ambulatory Visit: Payer: Self-pay | Admitting: Family Medicine

## 2024-02-06 LAB — CBC WITH DIFFERENTIAL/PLATELET
Basophils Absolute: 0.1 K/uL (ref 0.0–0.1)
Basophils Relative: 1.1 % (ref 0.0–3.0)
Eosinophils Absolute: 0.2 K/uL (ref 0.0–0.7)
Eosinophils Relative: 2.9 % (ref 0.0–5.0)
HCT: 47.4 % (ref 39.0–52.0)
Hemoglobin: 15.8 g/dL (ref 13.0–17.0)
Lymphocytes Relative: 30.2 % (ref 12.0–46.0)
Lymphs Abs: 2 K/uL (ref 0.7–4.0)
MCHC: 33.4 g/dL (ref 30.0–36.0)
MCV: 88 fl (ref 78.0–100.0)
Monocytes Absolute: 0.7 K/uL (ref 0.1–1.0)
Monocytes Relative: 11 % (ref 3.0–12.0)
Neutro Abs: 3.6 K/uL (ref 1.4–7.7)
Neutrophils Relative %: 54.8 % (ref 43.0–77.0)
Platelets: 277 K/uL (ref 150.0–400.0)
RBC: 5.39 Mil/uL (ref 4.22–5.81)
RDW: 14.5 % (ref 11.5–15.5)
WBC: 6.7 K/uL (ref 4.0–10.5)

## 2024-02-06 LAB — LIPID PANEL
Cholesterol: 198 mg/dL (ref 28–200)
HDL: 50.4 mg/dL
LDL Cholesterol: 120 mg/dL — ABNORMAL HIGH (ref 10–99)
NonHDL: 147.81
Total CHOL/HDL Ratio: 4
Triglycerides: 141 mg/dL (ref 10.0–149.0)
VLDL: 28.2 mg/dL (ref 0.0–40.0)

## 2024-02-06 LAB — BASIC METABOLIC PANEL WITH GFR
BUN: 14 mg/dL (ref 6–23)
CO2: 28 meq/L (ref 19–32)
Calcium: 9.8 mg/dL (ref 8.4–10.5)
Chloride: 105 meq/L (ref 96–112)
Creatinine, Ser: 1.27 mg/dL (ref 0.40–1.50)
GFR: 64.13 mL/min
Glucose, Bld: 85 mg/dL (ref 70–99)
Potassium: 4.6 meq/L (ref 3.5–5.1)
Sodium: 139 meq/L (ref 135–145)

## 2024-02-06 LAB — HEPATIC FUNCTION PANEL
ALT: 32 U/L (ref 3–53)
AST: 25 U/L (ref 5–37)
Albumin: 4.2 g/dL (ref 3.5–5.2)
Alkaline Phosphatase: 59 U/L (ref 39–117)
Bilirubin, Direct: 0.1 mg/dL (ref 0.1–0.3)
Total Bilirubin: 0.4 mg/dL (ref 0.2–1.2)
Total Protein: 7.2 g/dL (ref 6.0–8.3)

## 2024-02-06 LAB — HEMOGLOBIN A1C: Hgb A1c MFr Bld: 6 % (ref 4.6–6.5)

## 2024-02-06 LAB — T4, FREE: Free T4: 0.84 ng/dL (ref 0.60–1.60)

## 2024-02-06 LAB — T3, FREE: T3, Free: 3.8 pg/mL (ref 2.3–4.2)

## 2024-02-06 LAB — TSH: TSH: 0.64 u[IU]/mL (ref 0.35–5.50)

## 2024-02-06 LAB — PSA: PSA: 1.47 ng/mL (ref 0.10–4.00)

## 2024-02-06 NOTE — Telephone Encounter (Signed)
 Pt had office visit with Dr Johnny yesterday regarding this, nothing further needed

## 2024-02-20 NOTE — Telephone Encounter (Signed)
 The most likely cause of his leg swelling is venous insufficiency. There is no cure, but the best treatment for this is wearing compression stockings. If he would like to see a specialist for this, I would be happy to refer him to Cardiology

## 2024-03-16 ENCOUNTER — Encounter
